# Patient Record
Sex: Male | Born: 1960 | Race: White | Hispanic: No | Marital: Married | Smoking: Former smoker
Health system: Southern US, Community
[De-identification: ages and names within clinical notes are randomized; demographics above are authoritative.]

## PROBLEM LIST (undated history)

## (undated) DIAGNOSIS — M2550 Pain in unspecified joint: Secondary | ICD-10-CM

## (undated) DIAGNOSIS — E538 Deficiency of other specified B group vitamins: Secondary | ICD-10-CM

## (undated) DIAGNOSIS — E119 Type 2 diabetes mellitus without complications: Secondary | ICD-10-CM

## (undated) DIAGNOSIS — R079 Chest pain, unspecified: Secondary | ICD-10-CM

## (undated) DIAGNOSIS — E785 Hyperlipidemia, unspecified: Secondary | ICD-10-CM

## (undated) DIAGNOSIS — M549 Dorsalgia, unspecified: Secondary | ICD-10-CM

## (undated) DIAGNOSIS — E878 Other disorders of electrolyte and fluid balance, not elsewhere classified: Secondary | ICD-10-CM

## (undated) HISTORY — DX: Deficiency of other specified B group vitamins: E53.8

## (undated) HISTORY — DX: Chest pain, unspecified: R07.9

## (undated) HISTORY — DX: Dorsalgia, unspecified: M54.9

## (undated) HISTORY — DX: Pain in unspecified joint: M25.50

---

## 2014-01-03 ENCOUNTER — Ambulatory Visit: Payer: Self-pay | Admitting: Unknown Physician Specialty

## 2014-01-07 HISTORY — PX: MEDIAL PARTIAL KNEE REPLACEMENT: SHX5965

## 2014-04-25 DIAGNOSIS — Z96651 Presence of right artificial knee joint: Secondary | ICD-10-CM | POA: Insufficient documentation

## 2014-05-25 DIAGNOSIS — N521 Erectile dysfunction due to diseases classified elsewhere: Secondary | ICD-10-CM

## 2014-05-25 DIAGNOSIS — Z79899 Other long term (current) drug therapy: Secondary | ICD-10-CM | POA: Insufficient documentation

## 2014-05-25 DIAGNOSIS — M1990 Unspecified osteoarthritis, unspecified site: Secondary | ICD-10-CM | POA: Insufficient documentation

## 2014-05-25 DIAGNOSIS — E119 Type 2 diabetes mellitus without complications: Secondary | ICD-10-CM | POA: Insufficient documentation

## 2014-05-25 DIAGNOSIS — E1169 Type 2 diabetes mellitus with other specified complication: Secondary | ICD-10-CM | POA: Insufficient documentation

## 2014-06-17 ENCOUNTER — Emergency Department: Payer: Self-pay | Admitting: Emergency Medicine

## 2015-01-11 ENCOUNTER — Ambulatory Visit: Payer: Self-pay | Admitting: Gastroenterology

## 2015-01-11 LAB — HM COLONOSCOPY

## 2015-03-04 LAB — SURGICAL PATHOLOGY

## 2015-06-10 DIAGNOSIS — E782 Mixed hyperlipidemia: Secondary | ICD-10-CM | POA: Insufficient documentation

## 2015-06-10 DIAGNOSIS — E1169 Type 2 diabetes mellitus with other specified complication: Secondary | ICD-10-CM | POA: Insufficient documentation

## 2015-06-13 ENCOUNTER — Ambulatory Visit
Admission: EM | Admit: 2015-06-13 | Discharge: 2015-06-13 | Disposition: A | Discharge status: Home / Self Care | Payer: 59 | Attending: Family Medicine | Admitting: Family Medicine

## 2015-06-13 DIAGNOSIS — S39012A Strain of muscle, fascia and tendon of lower back, initial encounter: Secondary | ICD-10-CM | POA: Diagnosis not present

## 2015-06-13 HISTORY — DX: Hyperlipidemia, unspecified: E78.5

## 2015-06-13 HISTORY — DX: Type 2 diabetes mellitus without complications: E11.9

## 2015-06-13 MED ORDER — IBUPROFEN 600 MG PO TABS: 600.0000 mg | ORAL_TABLET | Freq: Three times a day (TID) | ORAL | Status: AC

## 2015-06-13 NOTE — Discharge Instructions (Signed)
Use IBUPROFEN as directed.  Take with food. Return here or to PCP if severe pain, numbness, weakness or changes in movement. 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 About 1 in 5 people have back pain.The cause of low back pain is rarely dangerous. The pain often gets better over time.About half of people with a sudden onset of back pain feel better in just 2 weeks. About 8 in 10 people feel better by 6 weeks.  CAUSES Some common causes of back pain include:  Strain of the muscles or ligaments supporting the spine.  Wear and tear (degeneration) of the spinal discs.  Arthritis.  Direct injury to the back. DIAGNOSIS Most of the time, the direct cause of low back pain is not known.However, back pain can be treated effectively even when the exact cause of the pain is unknown.Answering your caregiver's questions about your overall health and symptoms is one of the most accurate ways to make sure the cause of your pain is not dangerous. If your caregiver needs more information, he or she may order lab work or imaging tests (X-rays or MRIs).However, even if imaging tests show changes in your back, this usually does not require surgery. HOME CARE INSTRUCTIONS For many people, back pain returns.Since low back pain is rarely dangerous, it is often a condition that people can learn to [facility] own.   Remain active. It is stressful on the back to sit or stand in one place. Do not sit, drive, or stand in one place for more than 30 minutes at a time. Take short walks on level surfaces as soon as pain allows.Try to increase the length of time you walk each day.  Do not stay in bed.Resting more than 1 or 2 days can delay your recovery.  Do not avoid exercise or work.Your body is made to move.It is not dangerous to be active, even though your back may hurt.Your back will likely heal  faster if you return to being active before your pain is gone.  Pay attention to your body when you bend and lift. Many people have less discomfortwhen lifting if they bend their knees, keep the load close to their bodies,and avoid twisting. Often, the most comfortable positions are those that put less stress on your recovering back.  Find a comfortable position to sleep. Use a firm mattress and lie on your side with your knees slightly bent. If you lie on your back, put a pillow under your knees.  Only take over-the-counter or prescription medicines as directed by your caregiver. Over-the-counter medicines to reduce pain and inflammation are often the most helpful.Your caregiver may prescribe muscle relaxant drugs.These medicines help dull your pain so you can more quickly return to your normal activities and healthy exercise.  Put ice on the injured area.  Put ice in a plastic bag.  Place a towel between your skin and the bag.  Leave the ice on for 15-20 minutes, 03-04 times a day for the first 2 to 3 days. After that, ice and heat may be alternated to reduce pain and spasms.  Ask your caregiver about trying back exercises and gentle massage. This may be of some benefit.  Avoid feeling anxious or stressed.Stress increases muscle tension and can worsen back pain.It is important to recognize when you are anxious or stressed and learn ways to manage it.Exercise is a great option.  SEEK MEDICAL CARE IF:  You have pain that is not relieved with rest or medicine.  You have pain that does not improve in 1 week.  You have new symptoms.  You are generally not feeling well. SEEK IMMEDIATE MEDICAL CARE IF:   You have pain that radiates from your back into your legs.  You develop new bowel or bladder control problems.  You have unusual weakness or numbness in your arms or legs.  You develop nausea or vomiting.  You develop abdominal pain.  You feel faint. Document Released:  10/26/2005 Document Revised: 04/26/2012 Document Reviewed: 02/27/2014 [facility] Patient Information 2015 [place], [place]. This information is not intended to replace advice given to you by your health care provider. Make sure you discuss any questions you have with your health care provider. Back Injury Prevention Back injuries can be extremely painful and difficult to heal. After having one back injury, you are much more likely to experience another later on. It is important to learn how to avoid injuring or re-injuring your back. The following tips can help you to prevent a back injury. PHYSICAL FITNESS  Exercise regularly and try to develop good tone in your abdominal muscles. Your abdominal muscles provide a lot of the support needed by your back.  Do aerobic exercises (walking, jogging, biking, swimming) regularly.  Do exercises that increase balance and strength (tai chi, yoga) regularly. This can decrease your risk of falling and injuring your back.  Stretch before and after exercising.  Maintain a healthy weight. The more you weigh, the more stress is placed on your back. For every pound of weight, 10 times that amount of pressure is placed on the back. DIET  Talk to your caregiver about how much calcium and vitamin D you need per day. These nutrients help to prevent weakening of the bones (osteoporosis). Osteoporosis can cause broken (fractured) bones that lead to back pain.  Include good sources of calcium in your diet, such as dairy products, green, leafy vegetables, and products with calcium added (fortified).  Include good sources of vitamin D in your diet, such as milk and foods that are fortified with vitamin D.  Consider taking a nutritional supplement or a multivitamin if needed.  Stop smoking if you smoke. POSTURE  Sit and stand up straight. Avoid leaning forward when you sit or hunching over when you stand.  Choose chairs with good low back (lumbar) support.  If you  work at a desk, sit close to your work so you do not need to lean over. Keep your chin tucked in. Keep your neck drawn back and elbows bent at a right angle. Your arms should look like the letter "L."  Sit high and close to the steering wheel when you drive. Add a lumbar support to your car seat if needed.  Avoid sitting or standing in one position for too long. Take breaks to get up, stretch, and walk around at least once every hour. Take breaks if you are driving for long periods of time.  Sleep on your side with your knees slightly bent, or sleep on your back with a pillow under your knees. Do not sleep on your stomach. LIFTING, TWISTING, AND REACHING  Avoid heavy lifting, especially repetitive lifting. If you must do heavy lifting:  Stretch before lifting.  Work slowly.  Rest between lifts.  Use carts and dollies to move objects when possible.  Make several small trips instead of carrying 1 heavy load.  Ask for help when  you need it.  Ask for help when moving big, awkward objects.  Follow these steps when lifting:  Stand with your feet shoulder-width apart.  Get as close to the object as you can. Do not try to pick up heavy objects that are far from your body.  Use handles or lifting straps if they are available.  Bend at your knees. Squat down, but keep your heels off the floor.  Keep your shoulders pulled back, your chin tucked in, and your back straight.  Lift the object slowly, tightening the muscles in your legs, abdomen, and buttocks. Keep the object as close to the center of your body as possible.  When you put a load down, use these same guidelines in reverse.  Do not:  Lift the object above your waist.  Twist at the waist while lifting or carrying a load. Move your feet if you need to turn, not your waist.  Bend over without bending at your knees.  Avoid reaching over your head, across a table, or for an object on a high surface. OTHER TIPS  Avoid wet  floors and keep sidewalks clear of ice to prevent falls.  Do not sleep on a mattress that is too soft or too hard.  Keep items that are used frequently within easy reach.  Put heavier objects on shelves at waist level and lighter objects on lower or higher shelves.  Find ways to decrease your stress, such as exercise, massage, or relaxation techniques. Stress can build up in your muscles. Tense muscles are more vulnerable to injury.  Seek treatment for depression or anxiety if needed. These conditions can increase your risk of developing back pain. SEEK MEDICAL CARE IF:  You injure your back.  You have questions about diet, exercise, or other ways to prevent back injuries. MAKE SURE YOU:  Understand these instructions.  Will watch your condition.  Will get help right away if you are not doing well or get worse. Document Released: 12/03/2004 Document Revised: 01/18/2012 Document Reviewed: 12/07/2011 [facility] Patient Information 2015 [place], [place]. This information is not intended to replace advice given to you by your health care provider. Make sure you discuss any questions you have with your health care provider.

## 2015-06-13 NOTE — ED Notes (Signed)
Patient states that on Sunday 06/09/2015 he picked up a lawn tractor and states that Monday he stayed out of work secondary to stiffness. States that he returned to work on Tuesday and was on his feet a lot and moving heavy objects and back has worsened. Patient states that he is still having a lot of stiffness worse with sitting.

## 2015-06-13 NOTE — ED Provider Notes (Signed)
CSN: [id number]     Arrival date & time 06/13/15  0803 History   First MD Initiated Contact with Patient 06/13/15 [phone]     Chief Complaint  Patient presents with  . Back Pain    The history is provided by the patient.    Patient states 4 days ago, he decided to lift a riding tractor that morning.  He bent over & felt a pop in lower back without any pain.  The following day he stayed home from work & used ice, stretching & Icy hot.  He tried Ibu 800mg  that day.  He worked Tuesday & came home with severe stiffness. 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  Pain 4/10.  Pain worse with bending over.  Pain is in lower back, aching, does not radiate.  Denies numbness of extremities, weakness or paresthesias.  He gives hx of chronic lumbar back pain but denies any chronic pain medications.  He has seen a chiropractor years ago.  Ibu works very well for his pain.  Past Medical History  Diagnosis Date  . Diabetes   . Hyperlipidemia    Past Surgical History  Procedure Laterality Date  . 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 Angina Father    History  Substance Use Topics  . Smoking status: Former Smoker    Quit date: 11/09/1992  . Smokeless tobacco: Not on file  . Alcohol Use: No    Review of Systems  Constitutional: Negative.   HENT: Negative.   Eyes: Negative.   Respiratory: Negative.   Cardiovascular: Negative.   Endocrine: Negative.   Genitourinary: Negative.   Musculoskeletal: Positive for back pain. Negative for myalgias, joint swelling, arthralgias, gait problem, neck pain and neck stiffness.  Skin: Negative.   Neurological: Negative.   Hematological: Negative.   Psychiatric/Behavioral: Negative.     Allergies  Review of patient's allergies indicates no known allergies.  Home Medications   Prior to Admission medications   Medication Sig Start Date End Date Taking? Authorizing Provider  aspirin 81 MG  tablet Take 81 mg by mouth daily.   Yes Historical Provider, MD  atorvastatin (LIPITOR) 20 MG tablet Take 20 mg by mouth daily.   Yes Historical Provider, MD  ibuprofen (ADVIL,MOTRIN) 600 MG tablet Take 600 mg by mouth as needed.   Yes Historical Provider, MD  metFORMIN (GLUCOPHAGE) 500 MG tablet Take 500 mg by mouth 3 (three) times daily.   Yes Historical Provider, MD   BP 135/80 mmHg  Pulse 67  Temp(Src) 98.3 F (36.8 C) (Tympanic)  Resp 17  Ht 6\' 8"  (2.032 m)  Wt 390 lb (176.903 kg)  BMI 42.84 kg/m2  SpO2 97% Physical Exam  Constitutional: He is oriented to person, place, and time. He appears well-developed. No distress.  Morbidly obese  HENT:  Head: Normocephalic and atraumatic.  Eyes: Conjunctivae are normal. Right eye exhibits no discharge. Left eye exhibits no discharge. No scleral icterus.  Neck: Normal range of motion. Neck supple. No thyromegaly present.  Cardiovascular: Normal rate and regular rhythm.  Exam reveals no gallop and no friction rub.   No murmur heard. Pulmonary/Chest: Effort normal. No respiratory distress.  Abdominal: Soft. He exhibits no distension.  Musculoskeletal: He exhibits no edema.       Lumbar back: He exhibits tenderness, pain and spasm. He exhibits normal range of motion, no bony tenderness, no swelling, no edema, no deformity, no laceration  and normal pulse.       Back:  Lymphadenopathy:    He has no cervical adenopathy.  Neurological: He is alert and oriented to person, place, and time. He has normal strength and normal reflexes. He displays no atrophy and no tremor. No cranial nerve deficit or sensory deficit. He exhibits normal muscle tone. Coordination and gait normal.  Skin: Skin is warm, dry and intact. No rash noted. He is not diaphoretic. No cyanosis. Nails show no clubbing.  Psychiatric: He has a normal mood and affect. His speech is normal and behavior is normal. Judgment and thought content normal. Cognition and memory are normal.     ED Course  Procedures  MDM   1. Low back strain, initial encounter    Patient has acute lumboscaral strain s/p heavy lifting. No alarm features. Neuro exam intact and good movement. Patient reassured. He understands warning signs and when to return to the clinic.  Discussed his care with Dr. Thurmond Butts.  Plan: 1. Rx as per orders; risks, benefits, potential side effects reviewed with patient 2. Return here or to PCP if severe pain, numbness, weakness or changes in movement. 3. Work note given 4. Warm moist compresses for at least 4 times daily   Joselyn Arrow, NP 06/13/15 [phone]

## 2015-08-03 ENCOUNTER — Encounter: Payer: Self-pay | Admitting: Emergency Medicine

## 2015-08-03 ENCOUNTER — Ambulatory Visit
Admission: EM | Admit: 2015-08-03 | Discharge: 2015-08-03 | Disposition: A | Discharge status: Home / Self Care | Payer: 59 | Attending: Family Medicine | Admitting: Family Medicine

## 2015-08-03 DIAGNOSIS — J40 Bronchitis, not specified as acute or chronic: Secondary | ICD-10-CM | POA: Diagnosis not present

## 2015-08-03 DIAGNOSIS — J0100 Acute maxillary sinusitis, unspecified: Secondary | ICD-10-CM

## 2015-08-03 MED ORDER — AZITHROMYCIN 250 MG PO TABS: ORAL_TABLET | ORAL | Status: DC

## 2015-08-03 NOTE — ED Notes (Signed)
Congestion in chest for week&a half. Cough worse with lying down. No fevers.

## 2015-08-03 NOTE — ED Provider Notes (Signed)
CSN: [id number]     Arrival date & time 08/03/15  0828 History   First MD Initiated Contact with Patient 08/03/15 [phone]     Chief Complaint  Patient presents with  . chest congestion    (Consider location/radiation/quality/duration/timing/severity/associated sxs/prior Treatment) Patient is a 54 y.o. male presenting with cough. The history is provided by the patient.  Cough Cough characteristics:  Productive Severity:  Moderate Onset quality:  Sudden Duration:  2 weeks Timing:  Constant Chronicity:  New Smoker: no (former smoker for nine years; quit over 20 yrs ago)   Associated symptoms: chills, shortness of breath and sinus congestion     Past Medical History  Diagnosis Date  . Diabetes   . Hyperlipidemia    Past Surgical History  Procedure Laterality Date  . 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 Angina Father    Social History  Substance Use Topics  . Smoking status: Former Smoker    Quit date: 11/09/1992  . Smokeless tobacco: None  . Alcohol Use: No    Review of Systems  Constitutional: Positive for chills.  Respiratory: Positive for cough and shortness of breath.     Allergies  Review of patient's allergies indicates no known allergies.  Home Medications   Prior to Admission medications   Medication Sig Start Date End Date Taking? Authorizing Provider  aspirin 81 MG tablet Take 81 mg by mouth daily.   Yes Historical Provider, MD  atorvastatin (LIPITOR) 20 MG tablet Take 20 mg by mouth daily.   Yes Historical Provider, MD  ibuprofen (ADVIL,MOTRIN) 600 MG tablet Take 1 tablet (600 mg total) by mouth 3 (three) times daily. 06/13/15  Yes Joselyn Arrow, NP  metFORMIN (GLUCOPHAGE) 500 MG tablet Take 500 mg by mouth 3 (three) times daily.   Yes Historical Provider, MD  azithromycin (ZITHROMAX Z-PAK) 250 MG tablet 2 tabs po once day 1, then 1 tab po qd for next 4 days 08/03/15   Payton Mccallum, MD   Meds  Ordered and Administered this Visit  Medications - No data to display  BP 143/76 mmHg  Pulse 64  Temp(Src) 98.3 F (36.8 C) (Oral)  Resp 20  Ht  (2.007 m)  Wt 390 lb (176.903 kg)  BMI 43.92 kg/m2  SpO2 100% No data found.   Physical Exam  Constitutional: He appears well-developed and well-nourished. No distress.  HENT:  Head: Normocephalic and atraumatic.  Right Ear: Tympanic membrane, external ear and ear canal normal.  Left Ear: Tympanic membrane, external ear and ear canal normal.  Nose: Mucosal edema and rhinorrhea present.  Mouth/Throat: Uvula is midline, oropharynx is clear and moist and mucous membranes are normal. No oropharyngeal exudate or tonsillar abscesses.  Eyes: Conjunctivae and EOM are normal. Pupils are equal, round, and reactive to light. Right eye exhibits no discharge. Left eye exhibits no discharge. No scleral icterus.  Neck: Normal range of motion. Neck supple. No tracheal deviation present. No thyromegaly present.  Cardiovascular: Normal rate, regular rhythm and normal heart sounds.   Pulmonary/Chest: Effort normal. No stridor. No respiratory distress. He has no wheezes. He has no rales. He exhibits no tenderness.  Diffuse rhonchi  Lymphadenopathy:    He has no cervical adenopathy.  Neurological: He is alert.  Skin: Skin is warm and dry. No rash noted. He is not diaphoretic.  Nursing note and vitals reviewed.   ED Course  Procedures (including critical care time)  Labs  Review Labs Reviewed - No data to display  Imaging Review No results found.   Visual Acuity Review  Right Eye Distance:   Left Eye Distance:   Bilateral Distance:    Right Eye Near:   Left Eye Near:    Bilateral Near:         MDM   1. Bronchitis   2. Acute maxillary sinusitis, recurrence not specified    Discharge Medication List as of 08/03/2015  9:49 AM    START taking these medications   Details  azithromycin (ZITHROMAX Z-PAK) 250 MG tablet 2 tabs po once  day 1, then 1 tab po qd for next 4 days, Normal      Plan: 1  diagnosis reviewed with patient 2. rx as per orders; risks, benefits, potential side effects reviewed with patient 3. Recommend supportive treatment with increased fluids, rest, otc cough medication 4. F/u prn if symptoms worsen or don't improve    Payton Mccallum, MD 08/03/15 [phone]

## 2015-09-29 ENCOUNTER — Encounter: Payer: Self-pay | Admitting: Gynecology

## 2015-09-29 ENCOUNTER — Ambulatory Visit
Admission: EM | Admit: 2015-09-29 | Discharge: 2015-09-29 | Disposition: A | Discharge status: Home / Self Care | Payer: 59 | Attending: Family Medicine | Admitting: Family Medicine

## 2015-09-29 DIAGNOSIS — R0981 Nasal congestion: Secondary | ICD-10-CM | POA: Diagnosis not present

## 2015-09-29 DIAGNOSIS — H109 Unspecified conjunctivitis: Secondary | ICD-10-CM | POA: Diagnosis not present

## 2015-09-29 MED ORDER — POLYMYXIN B-TRIMETHOPRIM 10000-0.1 UNIT/ML-% OP SOLN: 1.0000 [drp] | Freq: Four times a day (QID) | OPHTHALMIC | Status: AC

## 2015-09-29 MED ORDER — AZITHROMYCIN 250 MG PO TABS: ORAL_TABLET | ORAL | Status: DC

## 2015-09-29 NOTE — ED Notes (Signed)
Bilateral eye mucous /oozing / redness.

## 2015-09-29 NOTE — ED Provider Notes (Signed)
Patient presents today with symptoms of bilateral mattiness to eyes starting this morning. Patient has had some nasal congestion with some yellow drainage for the last few days. He denies any productive cough, fever. He does wear glasses. He denies wearing contact lenses. He denies any vision loss or eye pain.  ROS: Negative except mentioned above.  Vitals as per Epic. GENERAL: NAD HEENT: no pharyngeal erythema, no exudate, no erythema of TMs, mild erythema of bilateral conjunctiva, minimal dry discharge on eyelids, PERRL, EOMI RESP: CTA B CARD: RRR NEURO: CN II-XII grossly intact   A/P: 1)Bilateral Conjunctivitis- will treat with Polytrim Optho., Claritin when necessary, wash hands frequently, wash pillow cases and linens that of touch face, work excuse for tomorrow, if symptoms do persist or worsen I do recommend that patient follow up with eye doctor. 2)Nasal Congestion- discussed use of Claritin, Zithromax prescribed, seek medical attention if symptoms persist or worsen.    Jolene ProvostKirtida Stephen Turnbaugh, MD 09/29/15 1007

## 2016-01-05 ENCOUNTER — Ambulatory Visit
Admission: EM | Admit: 2016-01-05 | Discharge: 2016-01-05 | Disposition: A | Discharge status: Home / Self Care | Payer: 59 | Attending: Family Medicine | Admitting: Family Medicine

## 2016-01-05 DIAGNOSIS — J069 Acute upper respiratory infection, unspecified: Secondary | ICD-10-CM | POA: Diagnosis not present

## 2016-01-05 DIAGNOSIS — B9789 Other viral agents as the cause of diseases classified elsewhere: Principal | ICD-10-CM

## 2016-01-05 HISTORY — DX: Other disorders of electrolyte and fluid balance, not elsewhere classified: E87.8

## 2016-01-05 MED ORDER — HYDROCOD POLST-CPM POLST ER 10-8 MG/5ML PO SUER: 5.0000 mL | Freq: Two times a day (BID) | ORAL | Status: DC

## 2016-01-05 NOTE — ED Provider Notes (Signed)
CSN: [id number]     Arrival date & time 01/05/16  7829 History   First MD Initiated Contact with Patient 01/05/16 0914     Chief Complaint  Patient presents with  . URI   (Consider location/radiation/quality/duration/timing/severity/associated sxs/prior Treatment) Patient is a 55 y.o. male presenting with URI. The history is provided by the patient.  URI Presenting symptoms: congestion, cough, fever and rhinorrhea   Presenting symptoms: no ear pain and no facial pain   Severity:  Moderate Onset quality:  Sudden Duration:  5 days Timing:  Constant Progression:  Waxing and waning Chronicity:  New Relieved by:  None tried Ineffective treatments:  None tried Associated symptoms: myalgias   Associated symptoms: no headaches, no neck pain, no sinus pain, no sneezing and no wheezing   Risk factors: diabetes mellitus and sick contacts   Risk factors: not elderly, no chronic cardiac disease, no chronic kidney disease, no chronic respiratory disease, no immunosuppression, no recent illness and no recent travel     Past Medical History  Diagnosis Date  . Diabetes (HCC)   . Hyperlipidemia   . Hypochloremia    Past Surgical History  Procedure Laterality Date  . 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 Angina Father    Social History  Substance Use Topics  . Smoking status: Former Smoker    Quit date: 11/09/1992  . Smokeless tobacco: None  . Alcohol Use: No    Review of Systems  Constitutional: Positive for fever.  HENT: Positive for congestion and rhinorrhea. Negative for ear pain and sneezing.   Respiratory: Positive for cough. Negative for wheezing.   Musculoskeletal: Positive for myalgias. Negative for neck pain.  Neurological: Negative for headaches.    Allergies  Review of patient's allergies indicates no known allergies.  Home Medications   Prior to Admission medications   Medication Sig Start Date End Date  Taking? Authorizing Provider  aspirin 81 MG tablet Take 81 mg by mouth daily.   Yes Historical Provider, MD  atorvastatin (LIPITOR) 20 MG tablet Take 20 mg by mouth daily.   Yes Historical Provider, MD  metFORMIN (GLUCOPHAGE) 500 MG tablet Take 500 mg by mouth 3 (three) times daily.   Yes Historical Provider, MD  azithromycin (ZITHROMAX Z-PAK) 250 MG tablet Use for 5 days. 09/29/15   Jolene Provost, MD  chlorpheniramine-HYDROcodone (TUSSIONEX PENNKINETIC ER) 10-8 MG/5ML SUER Take 5 mLs by mouth 2 (two) times daily. 01/05/16   Payton Mccallum, MD  ibuprofen (ADVIL,MOTRIN) 600 MG tablet Take 1 tablet (600 mg total) by mouth 3 (three) times daily. 06/13/15   Joselyn Arrow, NP   Meds Ordered and Administered this Visit  Medications - No data to display  BP 135/81 mmHg  Pulse 64  Temp(Src) 98.6 F (37 C) (Oral)  Resp 16  Ht  (2.032 m)  Wt 400 lb (181.439 kg)  BMI 43.94 kg/m2  SpO2 96% No data found.   Physical Exam  Constitutional: He appears well-developed and well-nourished. No distress.  HENT:  Head: Normocephalic and atraumatic.  Right Ear: Tympanic membrane, external ear and ear canal normal.  Left Ear: Tympanic membrane, external ear and ear canal normal.  Nose: Rhinorrhea present.  Mouth/Throat: Uvula is midline and mucous membranes are normal. Posterior oropharyngeal erythema present. No oropharyngeal exudate, posterior oropharyngeal edema or tonsillar abscesses.  Eyes: Conjunctivae and EOM are normal. Pupils are equal, round, and reactive to light. Right eye exhibits no discharge.  Left eye exhibits no discharge. No scleral icterus.  Neck: Normal range of motion. Neck supple. No tracheal deviation present. No thyromegaly present.  Cardiovascular: Normal rate, regular rhythm and normal heart sounds.   Pulmonary/Chest: Effort normal and breath sounds normal. No stridor. No respiratory distress. He has no wheezes. He has no rales. He exhibits no tenderness.  Lymphadenopathy:     He has no cervical adenopathy.  Neurological: He is alert.  Skin: Skin is warm and dry. No rash noted. He is not diaphoretic.  Nursing note and vitals reviewed.   ED Course  Procedures (including critical care time)  Labs Review Labs Reviewed - No data to display  Imaging Review No results found.   Visual Acuity Review  Right Eye Distance:   Left Eye Distance:   Bilateral Distance:    Right Eye Near:   Left Eye Near:    Bilateral Near:         MDM   1. Viral URI with cough    Discharge Medication List as of 01/05/2016  9:24 AM    START taking these medications   Details  chlorpheniramine-HYDROcodone (TUSSIONEX PENNKINETIC ER) 10-8 MG/5ML SUER Take 5 mLs by mouth 2 (two) times daily., Starting 01/05/2016, Until Discontinued, Normal       1. diagnosis reviewed with patient 2. rx as per orders above; reviewed possible side effects, interactions, risks and benefits  3. Recommend supportive treatment with otc analgesics prn, increased fluids, rest 4. Follow-up prn if symptoms worsen or don't improve    Payton Mccallum, MD 01/05/16 [phone]

## 2016-01-05 NOTE — ED Notes (Signed)
Patient c/o fever, body aches, chest congestion, and cough which started Wednesday.

## 2016-01-07 ENCOUNTER — Other Ambulatory Visit: Payer: Self-pay | Admitting: Unknown Physician Specialty

## 2016-01-07 DIAGNOSIS — M1712 Unilateral primary osteoarthritis, left knee: Secondary | ICD-10-CM

## 2016-01-07 DIAGNOSIS — M4722 Other spondylosis with radiculopathy, cervical region: Secondary | ICD-10-CM

## 2016-01-10 ENCOUNTER — Ambulatory Visit
Admission: RE | Admit: 2016-01-10 | Discharge: 2016-01-10 | Disposition: A | Discharge status: Home / Self Care | Payer: 59 | Source: Ambulatory Visit | Attending: Unknown Physician Specialty | Admitting: Unknown Physician Specialty

## 2016-01-10 DIAGNOSIS — M1712 Unilateral primary osteoarthritis, left knee: Secondary | ICD-10-CM | POA: Insufficient documentation

## 2016-03-08 DIAGNOSIS — Z6841 Body Mass Index (BMI) 40.0 and over, adult: Secondary | ICD-10-CM | POA: Insufficient documentation

## 2016-05-05 DIAGNOSIS — Z96652 Presence of left artificial knee joint: Secondary | ICD-10-CM | POA: Insufficient documentation

## 2016-12-04 DIAGNOSIS — Z96652 Presence of left artificial knee joint: Secondary | ICD-10-CM | POA: Insufficient documentation

## 2016-12-05 DIAGNOSIS — E538 Deficiency of other specified B group vitamins: Secondary | ICD-10-CM | POA: Insufficient documentation

## 2017-02-01 ENCOUNTER — Encounter: Payer: 59 | Attending: Internal Medicine | Admitting: Dietician

## 2017-02-01 ENCOUNTER — Encounter: Payer: Self-pay | Admitting: Dietician

## 2017-02-01 VITALS — BP 112/80 | Ht >= 80 in | Wt >= 6400 oz

## 2017-02-01 DIAGNOSIS — E669 Obesity, unspecified: Secondary | ICD-10-CM | POA: Insufficient documentation

## 2017-02-01 DIAGNOSIS — Z6841 Body Mass Index (BMI) 40.0 and over, adult: Secondary | ICD-10-CM | POA: Insufficient documentation

## 2017-02-01 DIAGNOSIS — Z713 Dietary counseling and surveillance: Secondary | ICD-10-CM | POA: Diagnosis present

## 2017-02-01 DIAGNOSIS — E119 Type 2 diabetes mellitus without complications: Secondary | ICD-10-CM | POA: Insufficient documentation

## 2017-02-01 NOTE — Progress Notes (Signed)
Diabetes Self-Management Education  Visit Type: First/Initial  Appt. Start Time:1700 Appt. End Time: 1800  02/01/2017  Mr. Steve Rojas, identified by name and date of birth, is a 56 y.o. male with a diagnosis of Diabetes: Type 2.   ASSESSMENT  Blood pressure 112/80, height 6\' 8"  (2.032 m), weight (!) 405 lb 4.8 oz (183.8 kg). Body mass index is 44.52 kg/m. Obesity     Diabetes Self-Management Education - 02/01/17 1820      Visit Information   Visit Type First/Initial     Initial Visit   Diabetes Type Type 2     Health Coping   How would you rate your overall health? Fair     Psychosocial Assessment   Patient Belief/Attitude about Diabetes Motivated to manage diabetes   Self-care barriers None   Special Needs None   Preferred Learning Style Auditory   Learning Readiness Ready   What is the last grade level you completed in school? 12+ college     Pre-Education Assessment   Patient understands the diabetes disease and treatment process. Needs Review   Patient understands incorporating nutritional management into lifestyle. Needs Review   Patient undertands incorporating physical activity into lifestyle. Needs Review   Patient understands using medications safely. Needs Review   Patient understands monitoring blood glucose, interpreting and using results Needs Review   Patient understands prevention, detection, and treatment of acute complications. Needs Review   Patient understands prevention, detection, and treatment of chronic complications. Needs Review   Patient understands how to develop strategies to address psychosocial issues. Needs Review   Patient understands how to develop strategies to promote health/change behavior. Needs Review     Complications   Last HgB A1C per patient/outside source 9.9 %  (11-2016)   How often do you check your blood sugar? 1-2 times/day   Fasting Blood glucose range (mg/dL) [id number]   Have you had a dilated eye exam in the past 12  months? Yes  09-2016   Have you had a dental exam in the past 12 months? Yes  6 months ago   Are you checking your feet? Yes   How many days per week are you checking your feet? 4 (c/o mild pain and numbness in feet 2 hr daily-worse at night)     Dietary Intake   Breakfast --  eats 3 meals/day    Snack (morning) --  none   Lunch --  eats lunch at 12p-eats fried foods 2-3x/wk.   Snack (afternoon) --  none   Dinner --  eats supper at 6p   Snack (evening) --  eats snack at 8-9p=nuts, popcorn   Beverage(s) --  drinks 6-7 glasses of water/day and 2-3 glasses of sugar free beverages/day; drinks 2 fruit juices/day + milk 3x/day     Exercise   Exercise Type ADL's     Patient Education   Previous Diabetes Education Yes (please comment)  in [place] several years ago   Disease state  Definition of diabetes, type 1 and 2, and the diagnosis of diabetes;Explored patient's options for treatment of their diabetes   Nutrition management  Role of diet in the treatment of diabetes and the relationship between the three main macronutrients and blood glucose level;Food label reading, portion sizes and measuring food.;Carbohydrate counting   Physical activity and exercise  Role of exercise on diabetes management, blood pressure control and cardiac health.;Helped patient identify appropriate exercises in relation to his/her diabetes, diabetes complications and other health issue.   Medications Reviewed  patients medication for diabetes, action, purpose, timing of dose and side effects.   Monitoring Purpose and frequency of SMBG.;Identified appropriate SMBG and/or A1C goals.;Daily foot exams;Yearly dilated eye exam;Taught/discussed recording of test results and interpretation of SMBG.   Acute complications Taught treatment of hypoglycemia - the 15 rule.;Covered sick day management with medication and food.   Chronic complications Relationship between chronic complications and blood glucose control;Assessed  and discussed foot care and prevention of foot problems;Lipid levels, blood glucose control and heart disease;Identified and discussed with patient  current chronic complications;Dental care;Retinopathy and reason for yearly dilated eye exams;Nephropathy, what it is, prevention of, the use of ACE, ARB's and early detection of through urine microalbumia.;Reviewed with patient heart disease, higher risk of, and prevention   Psychosocial adjustment Role of stress on diabetes   Personal strategies to promote health Lifestyle issues that need to be addressed for better diabetes care;Helped patient develop diabetes management plan for (enter comment)      Individualized Plan for Diabetes Self-Management Training:   Learning Objective:  Patient will have a greater understanding of diabetes self-management. Patient education plan is to attend individual and/or group sessions per assessed needs and concerns.   Plan:   Patient Instructions   Check blood sugars 2 x day before breakfast and 2 hrs after supper every day and record  Exercise:  Ride  Bike /do strenthening exercises  for    15-20  minutes 3 days a week  Avoid sugar sweetened drinks (soda, tea, coffee, sports drinks, juices)  Limit intake of sweets and fried foods  Eat 3 meals day,   2  snacks a day in afternoon and at bedtime  Space meals 4-5 hours apart  Eat 4 carbohydrate servings/meal + proteinEat 1 carbohydrate serving/snack + protein  Complete 3 Day Food Record and bring to next appt  Make a dentist  appointment  Bring blood sugar records to the next appointment/class  Get a Sharps container  Carry fast acting glucose at all times  Return for appointment/classes on:  03-05-17   Expected Outcomes:   positive  Education material provided: General meal planning guidelines, low BG handout, Living Well With Diabetes booklet, Foot care handout, A1C handout  If problems or questions, patient to contact team via:   [phone]  Future DSME appointment:  03-05-17

## 2017-02-01 NOTE — Patient Instructions (Signed)
  Check blood sugars 2 x day before breakfast and 2 hrs after supper every day and record  Exercise:  Ride  Bike /do strenthening exercises  for    15-20  minutes 3 days a week  Avoid sugar sweetened drinks (soda, tea, coffee, sports drinks, juices)  Limit intake of sweets and fried foods  Eat 3 meals day,   2  snacks a day in afternoon and at bedtime  Space meals 4-5 hours apart  Eat 4 carbohydrate servings/meal + proteinEat 1 carbohydrate serving/snack + protein  Complete 3 Day Food Record and bring to next appt  Make a dentist  appointment  Bring blood sugar records to the next appointment/class  Get a Sharps container  Carry fast acting glucose at all times  Return for appointment/classes on:  03-05-17

## 2017-03-05 ENCOUNTER — Encounter: Payer: Self-pay | Admitting: Dietician

## 2017-03-05 ENCOUNTER — Encounter: Payer: 59 | Attending: Internal Medicine | Admitting: Dietician

## 2017-03-05 VITALS — BP 130/88 | Ht >= 80 in | Wt >= 6400 oz

## 2017-03-05 DIAGNOSIS — E669 Obesity, unspecified: Secondary | ICD-10-CM | POA: Diagnosis not present

## 2017-03-05 DIAGNOSIS — E119 Type 2 diabetes mellitus without complications: Secondary | ICD-10-CM | POA: Insufficient documentation

## 2017-03-05 DIAGNOSIS — Z6841 Body Mass Index (BMI) 40.0 and over, adult: Secondary | ICD-10-CM | POA: Insufficient documentation

## 2017-03-05 DIAGNOSIS — Z713 Dietary counseling and surveillance: Secondary | ICD-10-CM | POA: Diagnosis not present

## 2017-03-05 NOTE — Progress Notes (Signed)
Diabetes Self-Management Education  Visit Type:  Follow-up  Appt. Start Time: 1115 Appt. End Time: 1215  03/05/2017  Mr. Steve Rojas, identified by name and date of birth, is a 56 y.o. male with a diagnosis of Diabetes:  .   ASSESSMENT  Blood pressure 130/88, height  (2.032 m), weight (!) 402 lb 1.6 oz (182.4 kg). Body mass index is 44.17 kg/m.       Diabetes Self-Management Education - 03/05/17 1118      Complications   How often do you check your blood sugar? 1-2 times/day   Fasting Blood glucose range (mg/dL) 161-096;04-540   Postprandial Blood glucose range (mg/dL) 981-191;478-295   Number of hypoglycemic episodes per month 1   Can you tell when your blood sugar is low? Yes   What do you do if your blood sugar is low? ate snack   Have you had a dilated eye exam in the past 12 months? Yes   Have you had a dental exam in the past 12 months? Yes   Are you checking your feet? Yes   How many days per week are you checking your feet? 4     Dietary Intake   Breakfast 3 meals and 1 snack daily     Exercise   Exercise Type Other (comment)  active job; care of large property on weekends   How many days per week to you exercise? 7     Patient Education   Nutrition management  Role of diet in the treatment of diabetes and the relationship between the three main macronutrients and blood glucose level;Food label reading, portion sizes and measuring food.;Information on hints to eating out and maintain blood glucose control.;Meal options for control of blood glucose level and chronic complications.;Other (comment)  basic meal planning for 2500-3000kcal intake 45%CHO   Physical activity and exercise  Role of exercise on diabetes management, blood pressure control and cardiac health.   Medications Reviewed patients medication for diabetes, action, purpose, timing of dose and side effects.   Monitoring Taught/discussed recording of test results and interpretation of SMBG.     Post-Education Assessment   Patient understands the diabetes disease and treatment process. Demonstrates understanding / competency   Patient understands incorporating nutritional management into lifestyle. Demonstrates understanding / competency   Patient undertands incorporating physical activity into lifestyle. Demonstrates understanding / competency   Patient understands using medications safely. Demonstrates understanding / competency   Patient understands monitoring blood glucose, interpreting and using results Demonstrates understanding / competency   Patient understands prevention, detection, and treatment of acute complications. Demonstrates understanding / competency   Patient understands prevention, detection, and treatment of chronic complications. Demonstrates understanding / competency   Patient understands how to develop strategies to address psychosocial issues. Demonstrates understanding / competency   Patient understands how to develop strategies to promote health/change behavior. Demonstrates understanding / competency     Outcomes   Program Status Completed      Learning Objective:  Patient will have a greater understanding of diabetes self-management. Patient education plan is to attend individual and/or group sessions per assessed needs and concerns.  Steve Rojas has been making diet changes for weight loss for several weeks, and has lost about 20lbs. He is participating in an online wellness program through his employer, and plans to continue. His goal is 100lbs weight loss. Calculated his energy needs for weight loss to be 2500-3000kcal, which, with 45%CHO, would amount to 75-90g carb. per meal, 18oz protein foods, and 93g  fat daily.    Plan:   Patient Instructions   Control portions of starches; eat generous portions of low-carb veggies and moderate portions of proteins.  Try starting with veggies and then eat other foods in your meals.   Limit any juice to 4oz,  dilute with diet soda or water.  OK to have a protein drink as part of a meal.   Keep up regular physical activity.   Continue to track intake as able, aiming for 2500-3000 calories daily.    Expected Outcomes:  Demonstrated interest in learning. Expect positive outcomes  Education material provided: Planning A Balanced Meal  If problems or questions, patient to contact team via:  Phone and Email

## 2017-03-05 NOTE — Patient Instructions (Signed)
   Control portions of starches; eat generous portions of low-carb veggies and moderate portions of proteins.  Try starting with veggies and then eat other foods in your meals.   Limit any juice to 4oz, dilute with diet soda or water.  OK to have a protein drink as part of a meal.   Keep up regular physical activity.   Continue to track intake as able, aiming for 2500-3000 calories daily.

## 2017-06-24 IMAGING — CT CT KNEE*L* W/O CM
1 of 3 series · 7 of 14 positions shown, 9 images · non-contrast
Comparison: None.

CLINICAL DATA: Preop for Drakce partial knee replacement surgery.

EXAM:
CT OF THE left KNEE WITHOUT CONTRAST
TECHNIQUE: Multidetector CT imaging of the left knee was performed according to
the standard protocol. Multiplanar CT image reconstructions were
also generated.

[Series 6: axial st · axial · 0.49mm/px · z∈[+423,+682]mm · 7 of 347 slices shown, 9 images]
[im 44/347  soft-tissue]
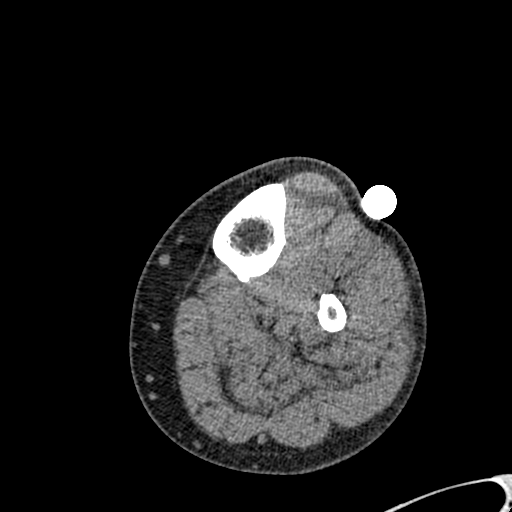
[im 44/347  bone]
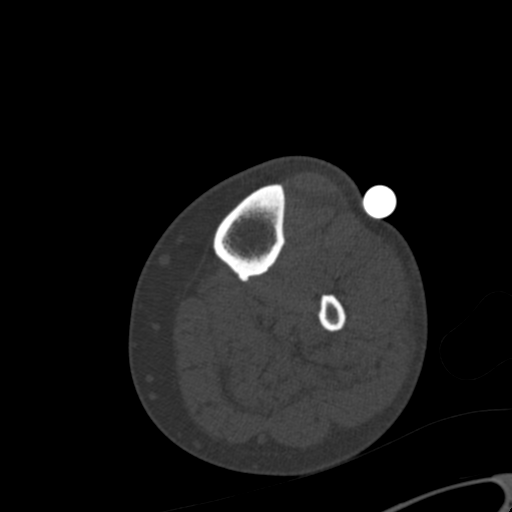
[im 87/347  bone]
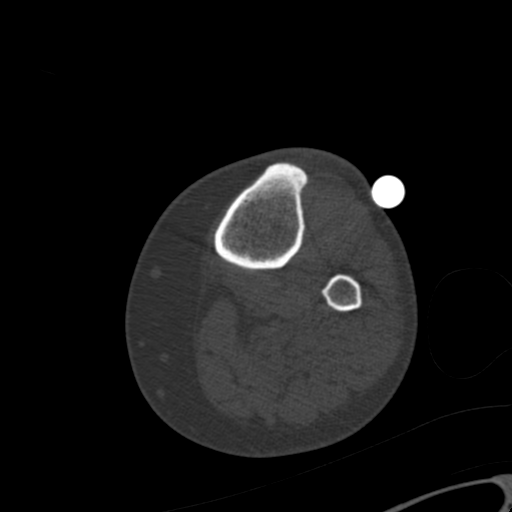
[im 130/347  bone]
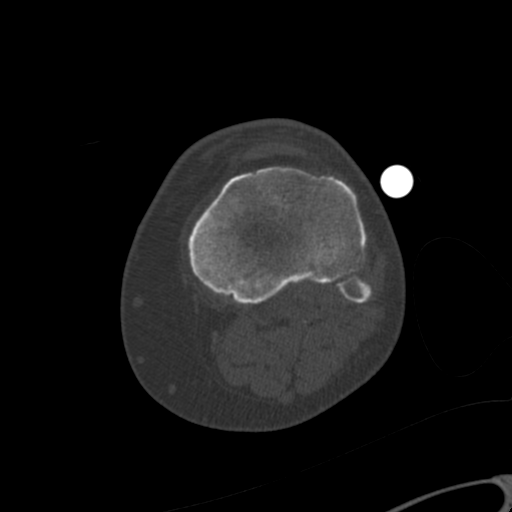
[im 174/347  bone]
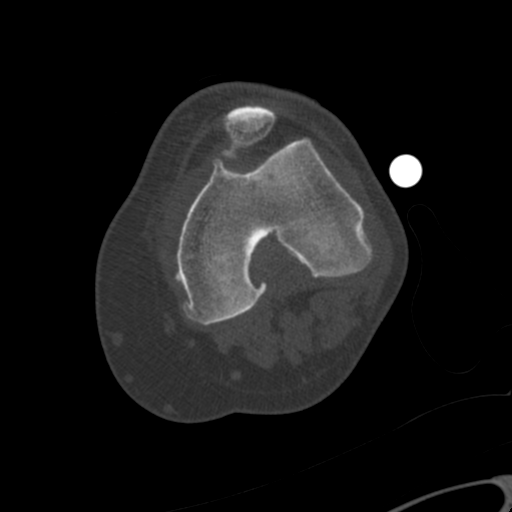
[im 217/347  soft-tissue]
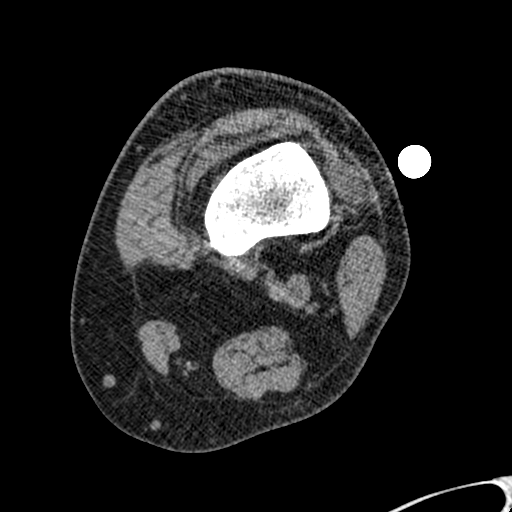
[im 217/347  bone]
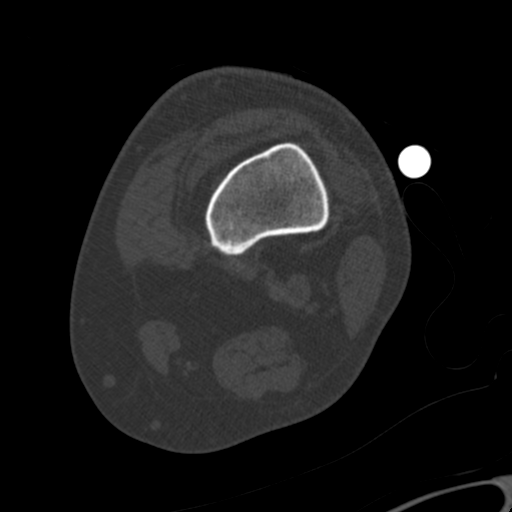
[im 260/347  bone]
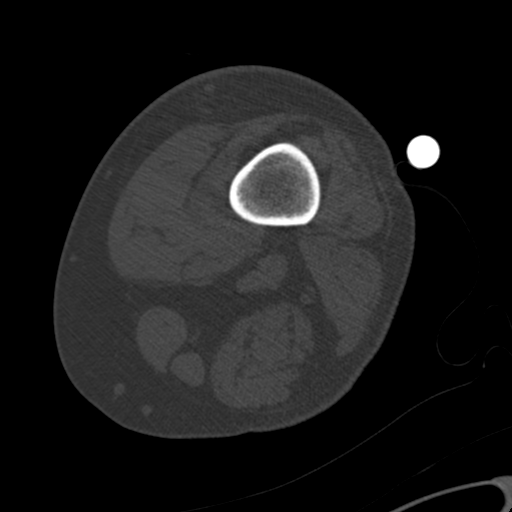
[im 303/347  bone]
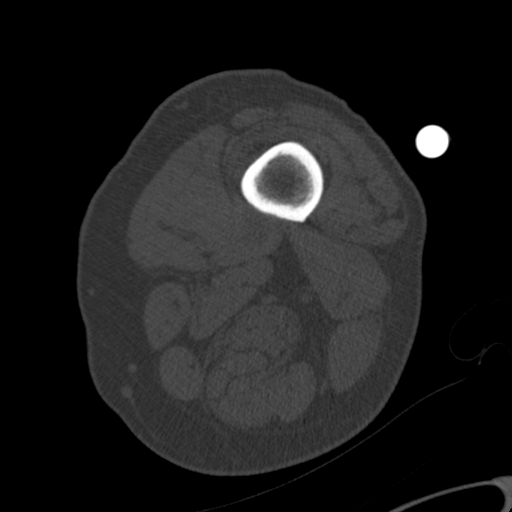

[7 of 14 positions shown; findings below may reference images not displayed]

FINDINGS: Left hip: Axial images do not demonstrate any significant
degenerative changes or AVN.

Left knee: Advanced medial compartment degenerative disease with
joint space narrowing, osteophytic spurring, bony eburnation and
subchondral cystic change. Mild to moderate degenerative changes
involving the patellofemoral joint and lateral compartment. No
worrisome bone lesions or osteochondral abnormality. Large
subchondral cysts are noted involving the posterior most aspect of
the medial tibial plateau.

Left ankle:

Mild ankle joint degenerative changes. No worrisome bone lesions or
osteochondral abnormality.
IMPRESSION: 1. Advanced medial compartment degenerative disease involving the
left knee as described above.
2. Mild degenerative changes involving the ankle and minimal
degenerative changes involving the hip.

## 2017-11-01 ENCOUNTER — Encounter: Payer: Self-pay | Admitting: *Deleted

## 2017-11-01 ENCOUNTER — Ambulatory Visit
Admission: EM | Admit: 2017-11-01 | Discharge: 2017-11-01 | Disposition: A | Discharge status: Home / Self Care | Payer: BLUE CROSS/BLUE SHIELD | Attending: Family Medicine | Admitting: Family Medicine

## 2017-11-01 DIAGNOSIS — H9201 Otalgia, right ear: Secondary | ICD-10-CM

## 2017-11-01 DIAGNOSIS — H6981 Other specified disorders of Eustachian tube, right ear: Secondary | ICD-10-CM | POA: Diagnosis not present

## 2017-11-01 MED ORDER — CETIRIZINE-PSEUDOEPHEDRINE ER 5-120 MG PO TB12: 1.0000 | ORAL_TABLET | Freq: Every day | ORAL | 0 refills | Status: AC

## 2017-11-01 MED ORDER — FLUTICASONE PROPIONATE 50 MCG/ACT NA SUSP: 2.0000 | Freq: Every day | NASAL | 0 refills | Status: AC

## 2017-11-01 NOTE — Discharge Instructions (Signed)
You may benefit from a course of Zyrtec D

## 2017-11-01 NOTE — ED Provider Notes (Signed)
[facility]    CSN: [id number] Arrival date & time: 11/01/17  [id number]     History   Chief Complaint Chief Complaint  Patient presents with  . Otalgia    HPI Steve Rojas is a 56 y.o. male.   HPI  This a 56 year old male who presents with 3-5-day history of right ear pain.  He has had no drainage or fever.  He feels as though the ear is "full".  Denies any tinnitus.  He did have a upper respiratory infection that he was fighting for over a month has since subsided.  He also relates an infection in his left nostril stuffiness that has since improved.  He denies any ear pressure changes such as flight or altitude.         Past Medical History:  Diagnosis Date  . Diabetes (HCC)   . Hyperlipidemia   . Hypochloremia     Patient Active Problem List   Diagnosis Date Noted  . B12 deficiency 12/05/2016  . Status post left unicompartmental knee replacement 12/04/2016  . Status post unicompartmental knee replacement, left 05/05/2016  . Body mass index (BMI) of 45.0-49.9 in adult (HCC) 03/08/2016  . Primary osteoarthritis of left knee 01/07/2016  . Hyperlipidemia, mixed 06/10/2015  . Degenerative joint disease 05/25/2014  . Diabetes mellitus type 2, uncomplicated (HCC) 05/25/2014  . Erectile dysfunction associated with type 2 diabetes mellitus (HCC) 05/25/2014  . High risk medication use 05/25/2014  . Status post right partial knee replacement 04/25/2014    Past Surgical History:  Procedure Laterality Date  . MEDIAL PARTIAL KNEE REPLACEMENT Right 01/2014       Home Medications    Prior to Admission medications   Medication Sig Start Date End Date Taking? Authorizing Provider  aspirin 81 MG tablet Take 81 mg by mouth daily.   Yes [provider]  atorvastatin (LIPITOR) 20 MG tablet Take 20 mg by mouth daily.   Yes [provider]  Cyanocobalamin (B-12 PO) Take 1 tablet by mouth daily.   Yes [provider]  ibuprofen  (ADVIL,MOTRIN) 600 MG tablet Take 1 tablet (600 mg total) by mouth 3 (three) times daily. Patient taking differently: Take 600 mg by mouth every 8 (eight) hours as needed.  06/13/15  Yes Joselyn ArrowJones, Kandice L, NP  metFORMIN (GLUCOPHAGE-XR) 500 MG 24 hr tablet Take 3 tablets by mouth daily. 12/04/16  Yes [provider]  cetirizine-pseudoephedrine (ZYRTEC-D) 5-120 MG tablet Take 1 tablet by mouth daily. 11/01/17   Lutricia Feiloemer, Toren Tucholski P, PA-C  fluticasone (FLONASE) 50 MCG/ACT nasal spray Place 2 sprays into both nostrils daily. 11/01/17   Lutricia Feiloemer, Solstice Lastinger P, PA-C  glimepiride (AMARYL) 2 MG tablet Take 1 tablet by mouth daily. 06/15/16 06/15/17  [provider]  tadalafil (CIALIS) 20 MG tablet Take 1 tablet by mouth daily as needed. As directed for ED 05/28/16 12/05/19  [provider]    Family History Family History  Problem Relation Age of Onset  . Angina Father   . CAD Mother     Social History Social History   Tobacco Use  . Smoking status: Former Smoker    Last attempt to quit: 11/09/1992    Years since quitting: 24.9  . Smokeless tobacco: Never Used  Substance Use Topics  . Alcohol use: No    Alcohol/week: 0.0 oz  . Drug use: No     Allergies   Penicillins   Review of Systems Review of Systems  Constitutional: Negative for activity change,  chills, fatigue and fever.  HENT: Positive for ear pain. Negative for hearing loss.   Respiratory: Negative for shortness of breath.   All other systems reviewed and are negative.    Physical Exam Triage Vital Signs ED Triage Vitals  Enc Vitals Group     BP 11/01/17 0837 (!) 170/90     Pulse Rate 11/01/17 0837 79     Resp 11/01/17 0837 16     Temp 11/01/17 0837 97.8 F (36.6 C)     Temp Source 11/01/17 0837 Oral     SpO2 11/01/17 0837 98 %     Weight 11/01/17 0839 (!) 400 lb (181.4 kg)     Height 11/01/17 0839 6\' 8"  (2.032 m)     Head Circumference --      Peak Flow --      Pain Score 11/01/17 0840 5     Pain  Loc --      Pain Edu? --      Excl. in GC? --    No data found.  Updated Vital Signs BP (!) 170/90 (BP Location: Left Arm)   Pulse 79   Temp 97.8 F (36.6 C) (Oral)   Resp 16   Ht 6\' 8"  (2.032 m)   Wt (!) 400 lb (181.4 kg)   SpO2 98%   BMI 43.94 kg/m   Visual Acuity Right Eye Distance:   Left Eye Distance:   Bilateral Distance:    Right Eye Near:   Left Eye Near:    Bilateral Near:     Physical Exam  Constitutional: He appears well-developed and well-nourished. No distress.  HENT:  Head: Normocephalic.  Left Ear: External ear normal.  Nose: Nose normal.  Mouth/Throat: Oropharynx is clear and moist. No oropharyngeal exudate.  Examination of the right ear shows the canal to be normal.  TM is very dark with decreased light reflex.  No tenderness with movement of the tragus.  He does have some tenderness along the inferior jaw line just inferior to the ear.  Eyes: Pupils are equal, round, and reactive to light. Right eye exhibits no discharge. Left eye exhibits no discharge.  Neck: Normal range of motion. Neck supple.  Skin: He is not diaphoretic.  Nursing note and vitals reviewed.    UC Treatments / Results  Labs (all labs ordered are listed, but only abnormal results are displayed) Labs Reviewed - No data to display  EKG  EKG Interpretation None       Radiology No results found.  Procedures Procedures (including critical care time)  Medications Ordered in UC Medications - No data to display   Initial Impression / Assessment and Plan / UC Course  I have reviewed the triage vital signs and the nursing notes.  Pertinent labs & imaging results that were available during my care of the patient were reviewed by me and considered in my medical decision making (see chart for details).     Plan: 1. Test/x-ray results and diagnosis reviewed with patient 2. rx as per orders; risks, benefits, potential side effects reviewed with patient 3. Recommend  supportive treatment with use of Flonase nasal spray and Zyrtec-D.  If is not improving in 2 weeks he should follow-up with ENT.  Name and address was provided to the patient. 4. F/u prn if symptoms worsen or don't improve   Final Clinical Impressions(s) / UC Diagnoses   Final diagnoses:  Eustachian tube dysfunction, right    ED Discharge Orders  Ordered    fluticasone (FLONASE) 50 MCG/ACT nasal spray  Daily     11/01/17 0921    cetirizine-pseudoephedrine (ZYRTEC-D) 5-120 MG tablet  Daily     11/01/17 0926       Controlled Substance Prescriptions [place] Controlled Substance Registry consulted? Not Applicable   Lutricia FeilRoemer, Hashem Goynes P, PA-C 11/01/17 [id number]

## 2017-11-01 NOTE — ED Triage Notes (Signed)
Right ear pain x3-5 days. Denies drainage and fever.

## 2021-05-22 DIAGNOSIS — M170 Bilateral primary osteoarthritis of knee: Secondary | ICD-10-CM | POA: Diagnosis not present

## 2021-05-22 DIAGNOSIS — E1169 Type 2 diabetes mellitus with other specified complication: Secondary | ICD-10-CM | POA: Diagnosis not present

## 2021-05-22 DIAGNOSIS — Z6841 Body Mass Index (BMI) 40.0 and over, adult: Secondary | ICD-10-CM | POA: Diagnosis not present

## 2021-05-22 DIAGNOSIS — Z79899 Other long term (current) drug therapy: Secondary | ICD-10-CM | POA: Diagnosis not present

## 2021-08-04 DIAGNOSIS — M5451 Vertebrogenic low back pain: Secondary | ICD-10-CM | POA: Diagnosis not present

## 2021-08-04 DIAGNOSIS — M9903 Segmental and somatic dysfunction of lumbar region: Secondary | ICD-10-CM | POA: Diagnosis not present

## 2021-08-04 DIAGNOSIS — M7918 Myalgia, other site: Secondary | ICD-10-CM | POA: Diagnosis not present

## 2021-08-04 DIAGNOSIS — M9904 Segmental and somatic dysfunction of sacral region: Secondary | ICD-10-CM | POA: Diagnosis not present

## 2021-08-06 DIAGNOSIS — M25552 Pain in left hip: Secondary | ICD-10-CM | POA: Diagnosis not present

## 2021-08-06 DIAGNOSIS — M5451 Vertebrogenic low back pain: Secondary | ICD-10-CM | POA: Diagnosis not present

## 2021-08-06 DIAGNOSIS — M9904 Segmental and somatic dysfunction of sacral region: Secondary | ICD-10-CM | POA: Diagnosis not present

## 2021-08-06 DIAGNOSIS — M9903 Segmental and somatic dysfunction of lumbar region: Secondary | ICD-10-CM | POA: Diagnosis not present

## 2021-08-07 DIAGNOSIS — M9904 Segmental and somatic dysfunction of sacral region: Secondary | ICD-10-CM | POA: Diagnosis not present

## 2021-08-07 DIAGNOSIS — M5451 Vertebrogenic low back pain: Secondary | ICD-10-CM | POA: Diagnosis not present

## 2021-08-07 DIAGNOSIS — M25552 Pain in left hip: Secondary | ICD-10-CM | POA: Diagnosis not present

## 2021-08-07 DIAGNOSIS — M9903 Segmental and somatic dysfunction of lumbar region: Secondary | ICD-10-CM | POA: Diagnosis not present

## 2021-08-11 DIAGNOSIS — M9903 Segmental and somatic dysfunction of lumbar region: Secondary | ICD-10-CM | POA: Diagnosis not present

## 2021-08-11 DIAGNOSIS — M5451 Vertebrogenic low back pain: Secondary | ICD-10-CM | POA: Diagnosis not present

## 2021-08-11 DIAGNOSIS — M9904 Segmental and somatic dysfunction of sacral region: Secondary | ICD-10-CM | POA: Diagnosis not present

## 2021-08-11 DIAGNOSIS — M25552 Pain in left hip: Secondary | ICD-10-CM | POA: Diagnosis not present

## 2021-08-22 DIAGNOSIS — Z79899 Other long term (current) drug therapy: Secondary | ICD-10-CM | POA: Diagnosis not present

## 2021-08-22 DIAGNOSIS — M170 Bilateral primary osteoarthritis of knee: Secondary | ICD-10-CM | POA: Diagnosis not present

## 2021-08-22 DIAGNOSIS — Z6841 Body Mass Index (BMI) 40.0 and over, adult: Secondary | ICD-10-CM | POA: Diagnosis not present

## 2021-08-22 DIAGNOSIS — Z0000 Encounter for general adult medical examination without abnormal findings: Secondary | ICD-10-CM | POA: Diagnosis not present

## 2021-08-22 DIAGNOSIS — E1169 Type 2 diabetes mellitus with other specified complication: Secondary | ICD-10-CM | POA: Diagnosis not present

## 2021-08-22 DIAGNOSIS — Z1159 Encounter for screening for other viral diseases: Secondary | ICD-10-CM | POA: Diagnosis not present

## 2021-08-22 DIAGNOSIS — Z23 Encounter for immunization: Secondary | ICD-10-CM | POA: Diagnosis not present

## 2021-11-25 DIAGNOSIS — E785 Hyperlipidemia, unspecified: Secondary | ICD-10-CM | POA: Diagnosis not present

## 2021-11-25 DIAGNOSIS — E1169 Type 2 diabetes mellitus with other specified complication: Secondary | ICD-10-CM | POA: Diagnosis not present

## 2021-11-25 DIAGNOSIS — Z79899 Other long term (current) drug therapy: Secondary | ICD-10-CM | POA: Diagnosis not present

## 2021-11-25 DIAGNOSIS — Z6841 Body Mass Index (BMI) 40.0 and over, adult: Secondary | ICD-10-CM | POA: Diagnosis not present

## 2022-01-02 DIAGNOSIS — M25552 Pain in left hip: Secondary | ICD-10-CM | POA: Diagnosis not present

## 2022-01-02 DIAGNOSIS — M5451 Vertebrogenic low back pain: Secondary | ICD-10-CM | POA: Diagnosis not present

## 2022-01-02 DIAGNOSIS — M9904 Segmental and somatic dysfunction of sacral region: Secondary | ICD-10-CM | POA: Diagnosis not present

## 2022-01-02 DIAGNOSIS — M9903 Segmental and somatic dysfunction of lumbar region: Secondary | ICD-10-CM | POA: Diagnosis not present

## 2022-01-05 DIAGNOSIS — M5451 Vertebrogenic low back pain: Secondary | ICD-10-CM | POA: Diagnosis not present

## 2022-01-05 DIAGNOSIS — M9903 Segmental and somatic dysfunction of lumbar region: Secondary | ICD-10-CM | POA: Diagnosis not present

## 2022-01-05 DIAGNOSIS — M9904 Segmental and somatic dysfunction of sacral region: Secondary | ICD-10-CM | POA: Diagnosis not present

## 2022-01-05 DIAGNOSIS — M25552 Pain in left hip: Secondary | ICD-10-CM | POA: Diagnosis not present

## 2022-01-06 DIAGNOSIS — M5451 Vertebrogenic low back pain: Secondary | ICD-10-CM | POA: Diagnosis not present

## 2022-01-06 DIAGNOSIS — M9904 Segmental and somatic dysfunction of sacral region: Secondary | ICD-10-CM | POA: Diagnosis not present

## 2022-01-06 DIAGNOSIS — M25552 Pain in left hip: Secondary | ICD-10-CM | POA: Diagnosis not present

## 2022-01-06 DIAGNOSIS — M9903 Segmental and somatic dysfunction of lumbar region: Secondary | ICD-10-CM | POA: Diagnosis not present

## 2022-01-09 DIAGNOSIS — M5451 Vertebrogenic low back pain: Secondary | ICD-10-CM | POA: Diagnosis not present

## 2022-01-09 DIAGNOSIS — M9903 Segmental and somatic dysfunction of lumbar region: Secondary | ICD-10-CM | POA: Diagnosis not present

## 2022-01-09 DIAGNOSIS — M9904 Segmental and somatic dysfunction of sacral region: Secondary | ICD-10-CM | POA: Diagnosis not present

## 2022-01-09 DIAGNOSIS — M25552 Pain in left hip: Secondary | ICD-10-CM | POA: Diagnosis not present

## 2022-01-12 DIAGNOSIS — M9904 Segmental and somatic dysfunction of sacral region: Secondary | ICD-10-CM | POA: Diagnosis not present

## 2022-01-12 DIAGNOSIS — M5451 Vertebrogenic low back pain: Secondary | ICD-10-CM | POA: Diagnosis not present

## 2022-01-12 DIAGNOSIS — M25552 Pain in left hip: Secondary | ICD-10-CM | POA: Diagnosis not present

## 2022-01-12 DIAGNOSIS — M9903 Segmental and somatic dysfunction of lumbar region: Secondary | ICD-10-CM | POA: Diagnosis not present

## 2022-03-13 DIAGNOSIS — B309 Viral conjunctivitis, unspecified: Secondary | ICD-10-CM | POA: Diagnosis not present

## 2022-03-16 DIAGNOSIS — Z6841 Body Mass Index (BMI) 40.0 and over, adult: Secondary | ICD-10-CM | POA: Diagnosis not present

## 2022-03-16 DIAGNOSIS — M545 Low back pain, unspecified: Secondary | ICD-10-CM | POA: Diagnosis not present

## 2022-03-16 DIAGNOSIS — Z79899 Other long term (current) drug therapy: Secondary | ICD-10-CM | POA: Diagnosis not present

## 2022-03-16 DIAGNOSIS — E785 Hyperlipidemia, unspecified: Secondary | ICD-10-CM | POA: Diagnosis not present

## 2022-03-16 DIAGNOSIS — E1169 Type 2 diabetes mellitus with other specified complication: Secondary | ICD-10-CM | POA: Diagnosis not present

## 2022-04-03 DIAGNOSIS — E119 Type 2 diabetes mellitus without complications: Secondary | ICD-10-CM | POA: Diagnosis not present

## 2022-06-09 DIAGNOSIS — E119 Type 2 diabetes mellitus without complications: Secondary | ICD-10-CM | POA: Diagnosis not present

## 2022-07-10 DIAGNOSIS — E119 Type 2 diabetes mellitus without complications: Secondary | ICD-10-CM | POA: Diagnosis not present

## 2022-08-05 DIAGNOSIS — R202 Paresthesia of skin: Secondary | ICD-10-CM | POA: Diagnosis not present

## 2022-08-05 DIAGNOSIS — E119 Type 2 diabetes mellitus without complications: Secondary | ICD-10-CM | POA: Diagnosis not present

## 2022-08-05 DIAGNOSIS — R200 Anesthesia of skin: Secondary | ICD-10-CM | POA: Diagnosis not present

## 2022-08-05 DIAGNOSIS — E1169 Type 2 diabetes mellitus with other specified complication: Secondary | ICD-10-CM | POA: Diagnosis not present

## 2022-08-09 DIAGNOSIS — E119 Type 2 diabetes mellitus without complications: Secondary | ICD-10-CM | POA: Diagnosis not present

## 2022-09-09 DIAGNOSIS — E119 Type 2 diabetes mellitus without complications: Secondary | ICD-10-CM | POA: Diagnosis not present

## 2022-09-29 DIAGNOSIS — E785 Hyperlipidemia, unspecified: Secondary | ICD-10-CM | POA: Diagnosis not present

## 2022-09-29 DIAGNOSIS — Z125 Encounter for screening for malignant neoplasm of prostate: Secondary | ICD-10-CM | POA: Diagnosis not present

## 2022-09-29 DIAGNOSIS — E1169 Type 2 diabetes mellitus with other specified complication: Secondary | ICD-10-CM | POA: Diagnosis not present

## 2022-09-29 DIAGNOSIS — I152 Hypertension secondary to endocrine disorders: Secondary | ICD-10-CM | POA: Diagnosis not present

## 2022-09-29 DIAGNOSIS — Z79899 Other long term (current) drug therapy: Secondary | ICD-10-CM | POA: Diagnosis not present

## 2022-09-29 DIAGNOSIS — Z6841 Body Mass Index (BMI) 40.0 and over, adult: Secondary | ICD-10-CM | POA: Diagnosis not present

## 2022-10-09 DIAGNOSIS — E119 Type 2 diabetes mellitus without complications: Secondary | ICD-10-CM | POA: Diagnosis not present

## 2022-11-09 DIAGNOSIS — E119 Type 2 diabetes mellitus without complications: Secondary | ICD-10-CM | POA: Diagnosis not present

## 2022-12-10 DIAGNOSIS — E119 Type 2 diabetes mellitus without complications: Secondary | ICD-10-CM | POA: Diagnosis not present

## 2022-12-30 DIAGNOSIS — I152 Hypertension secondary to endocrine disorders: Secondary | ICD-10-CM | POA: Diagnosis not present

## 2022-12-30 DIAGNOSIS — Z6841 Body Mass Index (BMI) 40.0 and over, adult: Secondary | ICD-10-CM | POA: Diagnosis not present

## 2022-12-30 DIAGNOSIS — Z0000 Encounter for general adult medical examination without abnormal findings: Secondary | ICD-10-CM | POA: Diagnosis not present

## 2022-12-30 DIAGNOSIS — Z79899 Other long term (current) drug therapy: Secondary | ICD-10-CM | POA: Diagnosis not present

## 2022-12-30 DIAGNOSIS — E1169 Type 2 diabetes mellitus with other specified complication: Secondary | ICD-10-CM | POA: Diagnosis not present

## 2023-01-08 DIAGNOSIS — E119 Type 2 diabetes mellitus without complications: Secondary | ICD-10-CM | POA: Diagnosis not present

## 2023-02-01 ENCOUNTER — Ambulatory Visit: Payer: BC Managed Care – PPO | Admitting: Nurse Practitioner

## 2023-02-01 ENCOUNTER — Encounter: Payer: Self-pay | Admitting: Nurse Practitioner

## 2023-02-01 VITALS — BP 151/81 | HR 81 | Temp 97.7°F | Ht >= 80 in | Wt >= 6400 oz

## 2023-02-01 DIAGNOSIS — E538 Deficiency of other specified B group vitamins: Secondary | ICD-10-CM | POA: Diagnosis not present

## 2023-02-01 DIAGNOSIS — E119 Type 2 diabetes mellitus without complications: Secondary | ICD-10-CM | POA: Diagnosis not present

## 2023-02-01 DIAGNOSIS — Z7985 Long-term (current) use of injectable non-insulin antidiabetic drugs: Secondary | ICD-10-CM

## 2023-02-01 DIAGNOSIS — Z6841 Body Mass Index (BMI) 40.0 and over, adult: Secondary | ICD-10-CM

## 2023-02-01 DIAGNOSIS — Z7984 Long term (current) use of oral hypoglycemic drugs: Secondary | ICD-10-CM

## 2023-02-01 NOTE — Progress Notes (Signed)
Office: [phone]  /  Fax: [phone]   Initial Visit  Steve Rojas was seen in clinic today to evaluate for obesity. He is interested in losing weight to improve overall health and reduce the risk of weight related complications. He presents today to review program treatment options, initial physical assessment, and evaluation.     He was referred by: PCP  When asked what else they would like to accomplish? He states: Improve existing medical conditions, Reduce number of medications, and Lose a target amount of weight : goal weight 300 lbs  Weight history:  He reports that he has always been overweight and has always struggled with his weight.    When asked how has your weight affected you? He states: Has affected self-esteem, Relationships, Contributed to medical problems, Contributed to orthopedic problems or mobility issues, Having fatigue, Having poor endurance, Problems with eating patterns, and Problems with depression and or anxiety  Some associated conditions:  DMT2 (diagnosed in 2002-2003), DJD, OA left knee, B12 def, HLD  Contributing factors: Family history, Stress, and Life event  Weight promoting medications identified: None  Current nutrition plan: He has tried multiple weight loss diets.  He is limiting his carbs and junk food intake.    Current level of physical activity: Walking and Strength training Goal is to start back swimming.  He is also biking 8-12 miles.    Current or previous pharmacotherapy: None  Response to medication: Never tried medications   Past medical history includes:   Past Medical History:  Diagnosis Date   Diabetes ([place])    Hyperlipidemia    Hypochloremia      Objective:   BP (!) 151/81   Pulse 81   Temp 97.7 F (36.5 C)   Ht 6\' 8"  (2.032 m)   Wt (!) 444 lb (201.4 kg)   SpO2 96%   BMI 48.78 kg/m  He was weighed on the bioimpedance scale: Body mass index is 48.78 kg/m.  Peak Weight:450 lbs , Body Fat%:44.8, Visceral  Fat Rating:33, Weight trend over the last 12 months: Increasing  General:  Alert, oriented and cooperative. Patient is in no acute distress.  Respiratory: Normal respiratory effort, no problems with respiration noted   Gait: able to ambulate independently  Mental Status: Normal mood and affect. Normal behavior. Normal judgment and thought content.   DIAGNOSTIC DATA REVIEWED:  BMET No results found for: "NA", "K", "CL", "CO2", "GLUCOSE", "BUN", "CREATININE", "CALCIUM", "GFRNONAA", "GFRAA" No results found for: "HGBA1C" No results found for: "INSULIN" CBC No results found for: "WBC", "RBC", "HGB", "HCT", "PLT", "MCV", "MCH", "MCHC", "RDW" Iron/TIBC/Ferritin/ %Sat No results found for: "IRON", "TIBC", "FERRITIN", "IRONPCTSAT" Lipid Panel  No results found for: "CHOL", "TRIG", "HDL", "CHOLHDL", "VLDL", "LDLCALC", "LDLDIRECT" Hepatic Function Panel  No results found for: "PROT", "ALBUMIN", "AST", "ALT", "ALKPHOS", "BILITOT", "BILIDIR", "IBILI" No results found for: "TSH"   Assessment and Plan:   Type 2 diabetes mellitus without complication, without long-term current use of insulin ([place]) Continue to follow-up with PCP.  Continue medications as directed.  Patient was recently prescribed Ozempic and was not able to take due to cost.  Patient is on Amaryl 4 mg, recently started on Actos and metformin XR 1500 mg daily.  Denies side effects.  He is on a statin and an aspirin 81 mg.  Denies side effects.  B12 deficiency Patient is taking vitamin B12 OTC.  Morbid obesity ([place])  BMI 45.0-49.9, adult ([place])    Obesity Treatment / Action Plan:  Patient will  work on garnering support from family and friends to begin weight loss journey. Will work on eliminating or reducing the presence of highly palatable, calorie dense foods in the home. Will complete provided nutritional and psychosocial assessment questionnaire before the next appointment. Will be scheduled for indirect calorimetry to  determine resting energy expenditure in a fasting state.  This will allow [place] to create a reduced calorie, high-protein meal plan to promote loss of fat mass while preserving muscle mass.  Obesity Education Performed Today:  He was weighed on the bioimpedance scale and results were discussed and documented in the synopsis.  We discussed obesity as a disease and the importance of a more detailed evaluation of all the factors contributing to the disease.  We discussed the importance of long term lifestyle changes which include nutrition, exercise and behavioral modifications as well as the importance of customizing this to his specific health and social needs.  We discussed the benefits of reaching a healthier weight to alleviate the symptoms of existing conditions and reduce the risks of the biomechanical, metabolic and psychological effects of obesity.  Steve Rojas appears to be in the action stage of change and states they are ready to start intensive lifestyle modifications and behavioral modifications.  30 minutes was spent today on this visit including the above counseling, pre-visit chart review, and post-visit documentation.  Reviewed by clinician on day of visit: allergies, medications, problem list, medical history, surgical history, family history, social history, and previous encounter notes pertinent to obesity diagnosis.    Steve Rojas Steve Spainhower FNP-C

## 2023-02-08 DIAGNOSIS — E119 Type 2 diabetes mellitus without complications: Secondary | ICD-10-CM | POA: Diagnosis not present

## 2023-02-18 ENCOUNTER — Ambulatory Visit (INDEPENDENT_AMBULATORY_CARE_PROVIDER_SITE_OTHER): Payer: BC Managed Care – PPO | Admitting: Bariatrics

## 2023-02-18 ENCOUNTER — Encounter: Payer: Self-pay | Admitting: Bariatrics

## 2023-02-18 VITALS — BP 143/89 | HR 73 | Temp 97.7°F | Ht >= 80 in | Wt >= 6400 oz

## 2023-02-18 DIAGNOSIS — R5383 Other fatigue: Secondary | ICD-10-CM

## 2023-02-18 DIAGNOSIS — Z1331 Encounter for screening for depression: Secondary | ICD-10-CM | POA: Diagnosis not present

## 2023-02-18 DIAGNOSIS — E559 Vitamin D deficiency, unspecified: Secondary | ICD-10-CM | POA: Diagnosis not present

## 2023-02-18 DIAGNOSIS — R0602 Shortness of breath: Secondary | ICD-10-CM | POA: Diagnosis not present

## 2023-02-18 DIAGNOSIS — R0683 Snoring: Secondary | ICD-10-CM

## 2023-02-18 DIAGNOSIS — E1169 Type 2 diabetes mellitus with other specified complication: Secondary | ICD-10-CM | POA: Diagnosis not present

## 2023-02-18 DIAGNOSIS — Z0289 Encounter for other administrative examinations: Secondary | ICD-10-CM

## 2023-02-18 DIAGNOSIS — Z7985 Long-term (current) use of injectable non-insulin antidiabetic drugs: Secondary | ICD-10-CM

## 2023-02-18 DIAGNOSIS — E538 Deficiency of other specified B group vitamins: Secondary | ICD-10-CM

## 2023-02-18 DIAGNOSIS — E785 Hyperlipidemia, unspecified: Secondary | ICD-10-CM

## 2023-02-18 DIAGNOSIS — Z6841 Body Mass Index (BMI) 40.0 and over, adult: Secondary | ICD-10-CM

## 2023-02-18 DIAGNOSIS — E119 Type 2 diabetes mellitus without complications: Secondary | ICD-10-CM | POA: Diagnosis not present

## 2023-02-19 LAB — VITAMIN B12: Vitamin B-12: 520 pg/mL (ref 232–1245)

## 2023-02-19 LAB — INSULIN, RANDOM: INSULIN: 9.4 u[IU]/mL (ref 2.6–24.9)

## 2023-02-19 LAB — VITAMIN D 25 HYDROXY (VIT D DEFICIENCY, FRACTURES): Vit D, 25-Hydroxy: 22.5 ng/mL — ABNORMAL LOW (ref 30.0–100.0)

## 2023-02-23 NOTE — Progress Notes (Signed)
Chief Complaint:   OBESITY Steve Rojas (MR# [id number]) is a 62 y.o. male who presents for evaluation and treatment of obesity and related comorbidities. Current BMI is Body mass index is 48.67 kg/m. Steve Rojas has been struggling with his weight for many years and has been unsuccessful in either losing weight, maintaining weight loss, or reaching his healthy weight goal.  Steve Rojas is currently in the action stage of change and ready to dedicate time achieving and maintaining a healthier weight. Blue is interested in becoming our patient and working on intensive lifestyle modifications including (but not limited to) diet and exercise for weight loss.  Steve Rojas's habits were reviewed today and are as follows: His family eats meals together, his desired weight loss is 143 lbs, he has been heavy most of his life, he started gaining weight after he quit smoking, his heaviest weight ever was 450 pounds, he has significant food cravings issues, he snacks frequently in the evenings, he skips meals frequently, he frequently makes poor food choices, he frequently eats larger portions than normal, and he struggles with emotional eating.  Depression Screen Steve Rojas's Food and Mood (modified PHQ-9) score was 15.  Subjective:   1. Other fatigue Steve Rojas admits to daytime somnolence and admits to waking up still tired. Patient has a history of symptoms of daytime fatigue and morning fatigue. Steve Rojas generally gets 7 or 8 hours of sleep per night, and states that he has nightime awakenings. Snoring is present. Apneic episodes are present. Epworth Sleepiness Score is 10.   2. SOB (shortness of breath) on exertion Steve Rojas notes increasing shortness of breath with exercising and seems to be worsening over time with weight gain. He notes getting out of breath sooner with activity than he used to. This has not gotten worse recently. Steve Rojas denies shortness of breath at rest or orthopnea.  3. Type 2 diabetes mellitus without  complication, without long-term current use of insulin Alias was diagnosed in 2007, and he has been on Ozempic, but notes it is too expensive. He is on metformin and Amaryl. Last A1c was 8.4, with no low blood sugar.   4. B12 deficiency Steve Rojas is taking B12, and his last B12 level was 241.  5. Hyperlipidemia associated with type 2 diabetes mellitus Steve Rojas is taking atorvastatin.   6. Snoring Steve Rojas has some apnea, and his neck size is 22 per the patient.   7. Vitamin D deficiency Steve Rojas is not on Vitamin D.   Assessment/Plan:   1. Other fatigue Steve Rojas does feel that his weight is causing his energy to be lower than it should be. Fatigue may be related to obesity, depression or many other causes. Labs will be ordered, and in the meanwhile, Steve Rojas will focus on self care including making healthy food choices, increasing physical activity and focusing on stress reduction.  2. SOB (shortness of breath) on exertion Steve Rojas does feel that he gets out of breath more easily that he used to when he exercises. Steve Rojas's shortness of breath appears to be obesity related and exercise induced. He has agreed to work on weight loss and gradually increase exercise to treat his exercise induced shortness of breath. Will continue to monitor closely.  3. Type 2 diabetes mellitus without complication, without long-term current use of insulin We will check labs today, and we will follow-up at [facility] next visit.   - Insulin, random  4. B12 deficiency We will check labs today, and we will follow-up at [facility] next visit.   -  Vitamin B12  5. Hyperlipidemia associated with type 2 diabetes mellitus Steve Rojas will continue his medications as directed.   6. Snoring Aund[place] has been scheduled for a sleep study.   - Ambulatory referral to Sleep Studies  7. Vitamin D deficiency We will check labs today, and we will follow-up at [facility] next visit.   - VITAMIN D 25 Hydroxy (Vit-D Deficiency, Fractures)  8. Depression  screening Shanan had a positive depression screening. Depression is commonly associated with obesity and often results in emotional eating behaviors. We will monitor this closely and work on CBT to help improve the non-hunger eating patterns. Referral to Psychology may be required if no improvement is seen as he continues in our clinic.  9. Morbid obesity  10. BMI 45.0-49.9, adult Steve Rojas is currently in the action stage of change and his goal is to continue with weight loss efforts. I recommend Steve Rojas begin the structured treatment plan as follows:  He has agreed to the Category 4 Plan + 200 calories and keeping a food journal and adhering to recommended goals of 2000 calories and 120-140 grams of protein.  Meal planning. Reviewed labs with the patient today, 12/30/2022 A1c 8.4 (11.6). B12, PSA 10/01/2022, CBC, CMP, lipid, and TSH. Decrease portion size, and will slow down his eating.  Exercise goals: No exercise has been prescribed at this time.   Behavioral modification strategies: increasing lean protein intake, decreasing simple carbohydrates, increasing vegetables, increasing water intake, decreasing eating out, no skipping meals, meal planning and cooking strategies, and keeping healthy foods in the home.  He was informed of the importance of frequent follow-up visits to maximize his success with intensive lifestyle modifications for his multiple health conditions. He was informed we would discuss his lab results at his next visit unless there is a critical issue that needs to be addressed sooner. Steve Rojas agreed to keep his next visit at the agreed upon time to discuss these results.  Objective:   Blood pressure (!) 143/89, pulse 73, temperature 97.7 F (36.5 C), height  (2.032 m), weight (!) 443 lb (200.9 kg), SpO2 96 %. Body mass index is 48.67 kg/m.  EKG: Normal sinus rhythm, rate (Unable to obtain).  Indirect Calorimeter completed today shows a VO2 of 471 and a REE of 3254.  His  calculated basal metabolic rate is (unable to determine) thus his basal metabolic rate is  (unable to determine)  than expected.  General: Cooperative, alert, well developed, in no acute distress. HEENT: Conjunctivae and lids unremarkable. Cardiovascular: Regular rhythm.  Lungs: Normal work of breathing. Neurologic: No focal deficits.   No results found for: "CREATININE", "BUN", "NA", "K", "CL", "CO2" No results found for: "ALT", "AST", "GGT", "ALKPHOS", "BILITOT" No results found for: "HGBA1C" Lab Results  Component Value Date   INSULIN 9.4 02/18/2023   No results found for: "TSH" No results found for: "CHOL", "HDL", "LDLCALC", "LDLDIRECT", "TRIG", "CHOLHDL" No results found for: "WBC", "HGB", "HCT", "MCV", "PLT" No results found for: "IRON", "TIBC", "FERRITIN"  Attestation Statements:   Reviewed by clinician on day of visit: allergies, medications, problem list, medical history, surgical history, family history, social history, and previous encounter notes.   Time spent on visit including pre-visit chart review and post-visit charting and care was 50 minutes.    Trude Mcburney, am acting as Energy manager for [employer], DO.  I have reviewed the above documentation for accuracy and completeness, and I agree with the above. - Theodoro Grist, DO

## 2023-03-04 ENCOUNTER — Ambulatory Visit: Payer: BC Managed Care – PPO | Admitting: Nurse Practitioner

## 2023-03-04 ENCOUNTER — Encounter: Payer: Self-pay | Admitting: Nurse Practitioner

## 2023-03-04 VITALS — BP 165/89 | HR 62 | Temp 97.6°F | Ht >= 80 in | Wt >= 6400 oz

## 2023-03-04 DIAGNOSIS — E1159 Type 2 diabetes mellitus with other circulatory complications: Secondary | ICD-10-CM | POA: Diagnosis not present

## 2023-03-04 DIAGNOSIS — I152 Hypertension secondary to endocrine disorders: Secondary | ICD-10-CM

## 2023-03-04 DIAGNOSIS — E119 Type 2 diabetes mellitus without complications: Secondary | ICD-10-CM

## 2023-03-04 DIAGNOSIS — Z7985 Long-term (current) use of injectable non-insulin antidiabetic drugs: Secondary | ICD-10-CM

## 2023-03-04 DIAGNOSIS — Z6841 Body Mass Index (BMI) 40.0 and over, adult: Secondary | ICD-10-CM

## 2023-03-04 DIAGNOSIS — E559 Vitamin D deficiency, unspecified: Secondary | ICD-10-CM

## 2023-03-04 MED ORDER — VITAMIN D (ERGOCALCIFEROL) 1.25 MG (50000 UNIT) PO CAPS
50000.0000 [IU] | ORAL_CAPSULE | ORAL | 0 refills | Status: DC
Start: 2023-03-04 — End: 2023-03-24

## 2023-03-04 MED ORDER — OZEMPIC (0.25 OR 0.5 MG/DOSE) 2 MG/3ML ~~LOC~~ SOPN
0.2500 mg | PEN_INJECTOR | SUBCUTANEOUS | 0 refills | Status: DC
Start: 2023-03-04 — End: 2023-04-06

## 2023-03-04 NOTE — Progress Notes (Deleted)
Office: [phone]  /  Fax: [phone]  WEIGHT SUMMARY AND BIOMETRICS  No data recorded No data recorded  No data recorded Anthropometric Measurements Height:  (2.032 m) Starting Weight: 443lb   No data recorded Other Clinical Data Today's Visit #: 2 Starting Date: 02/18/23     HPI  Chief Complaint: OBESITY  Steve Rojas is here to discuss his progress with his obesity treatment plan. He is on the keeping a food journal and adhering to recommended goals of 2000 calories and 120-140 protein and states he is following his eating plan approximately 25 % of the time. He states he is exercising 25-30 minutes 1-2 days per week.   Interval History:  Since last office visit he ***   Pharmacotherapy for weight loss: He {srtis (Optional):29129} currently taking {srtpreviousweightlossmeds (Optional):29124} for medical weight loss.  Denies side effects.    Previous pharmacotherapy for medical weight loss:  {srtpreviousweightlossmeds (Optional):29124}  He stopped *** due to side effects of ***.  Bariatric surgery:  Patient is status post {srtweightlosssurgery:29125} by Dr.  Marland Kitchen in ***.  His highest weight prior to surgery was *** and his nadir weight after surgery was ***.  He is taking {srtvitamins (Optional):29126}.  He reports {srtrestriction (Optional):29127} restriction.     PHYSICAL EXAM:  Height  (2.032 m). Body mass index is 48.67 kg/m.  General: He is overweight, cooperative, alert, well developed, and in no acute distress. PSYCH: Has normal mood, affect and thought process.   Extremities: No edema.  Neurologic: No gross sensory or motor deficits. No tremors or fasciculations noted.    DIAGNOSTIC DATA REVIEWED:  BMET No results found for: "NA", "K", "CL", "CO2", "GLUCOSE", "BUN", "CREATININE", "CALCIUM", "GFRNONAA", "GFRAA" No results found for: "HGBA1C" Lab Results  Component Value Date   INSULIN 9.4 02/18/2023   No results found for: "TSH" CBC No  results found for: "WBC", "RBC", "HGB", "HCT", "PLT", "MCV", "MCH", "MCHC", "RDW" Iron Studies No results found for: "IRON", "TIBC", "FERRITIN", "IRONPCTSAT" Lipid Panel  No results found for: "CHOL", "TRIG", "HDL", "CHOLHDL", "VLDL", "LDLCALC", "LDLDIRECT" Hepatic Function Panel  No results found for: "PROT", "ALBUMIN", "AST", "ALT", "ALKPHOS", "BILITOT", "BILIDIR", "IBILI" No results found for: "TSH" Nutritional Lab Results  Component Value Date   VD25OH 22.5 (L) 02/18/2023     ASSESSMENT AND PLAN  TREATMENT PLAN FOR OBESITY:  Recommended Dietary Goals  Steve Rojas is currently in the action stage of change. As such, his goal is to continue weight management plan. He has agreed to {MWMwtlossportion/plan2:23431}.  Behavioral Intervention  We discussed the following Behavioral Modification Strategies today: {EMWMwtlossstrategies:28914::"increasing lean protein intake","decreasing simple carbohydrates ","increasing vegetables","increasing lower glycemic fruits","increasing water intake","continue to practice mindfulness when eating","planning for success"}.  Additional resources provided today: NA  Recommended Physical Activity Goals  Steve Rojas has been advised to work up to 150 minutes of moderate intensity aerobic activity a week and strengthening exercises 2-3 times per week for cardiovascular health, weight loss maintenance and preservation of muscle mass.   He has agreed to {EMEXERCISE:28847}   Pharmacotherapy We discussed various medication options to help Steve Rojas with his weight loss efforts and we both agreed to ***.  ASSOCIATED CONDITIONS ADDRESSED TODAY  Action/Plan  There are no diagnoses linked to this encounter.       No follow-ups on file.Marland Kitchen He was informed of the importance of frequent follow up visits to maximize his success with intensive lifestyle modifications for his multiple health conditions.   ATTESTASTION STATEMENTS:  Reviewed by clinician on day of  visit: allergies, medications, problem list, medical history, surgical history, family history, social history, and previous encounter notes.   Time spent on visit including pre-visit chart review and post-visit care and charting was *** minutes.    Steve Rojas. [place] FNP-C

## 2023-03-04 NOTE — Progress Notes (Signed)
Office: [phone]  /  Fax: [phone]  WEIGHT SUMMARY AND BIOMETRICS  Weight Gained Since Last Visit: 6lb   Vitals Temp: 97.6 F (36.4 C) BP: (!) 165/89 Pulse Rate: 62 SpO2: 97 %   Anthropometric Measurements Height:  (2.032 m) Weight: (!) 449 lb (203.7 kg) BMI (Calculated): 49.33 Weight at Last Visit: 443lb Weight Lost Since Last Visit: 0 Weight Gained Since Last Visit: 6lb Starting Weight: 443lb Total Weight Loss (lbs): 0 lb (0 kg)   No data recorded Other Clinical Data Fasting: no Labs: no Today's Visit #: 2 Starting Date: 02/18/23     HPI  Chief Complaint: OBESITY  Steve Rojas is here to discuss his progress with his obesity treatment plan. He is on the keeping a food journal and adhering to recommended goals of 2000 calories and 120-140 protein and states he is following his eating plan approximately 25 % of the time. He states he is exercising 25-30 minutes 1-2 days per week.   Interval History:  Since last office visit he has gained 6 pounds.  Since his last visit he went back home to [place] to see his mother who is not doing well.  His wife lost both of her brothers.  Due to life events, he hasn't been able to focus on himself.      Pharmacotherapy for weight loss: He is not currently taking medications  for medical weight loss.    Previous pharmacotherapy for medical weight loss:  none  Bariatric surgery:  Patient has not had bariatric surgery.   Pharmacotherapy for DMT2:  He is currently taking Metformin XR  3 daily and Amaryl .  Denies side effects.   Last A1c was 8.4 CBGs: Fasting 140-180 Episodes of hypoglycemia: no On ASA  and statin.  Last eye exam:  2024 He has tried Ozempic in the past but was unable to continue due to cost    No results found for: "HGBA1C" No results found for: "GLUF", "MICROALBUR", "LDLCALC", "CREATININE"   Hypertension Hypertension poorly controlled.  Medication(s): was on meds in the past and  was stopped for unknown reason. Can't remember what he took in the past.   Denies chest pain, palpitations and SOB. FH:  father  BP Readings from Last 3 Encounters:  03/04/23 (!) 165/89  02/18/23 (!) 143/89  02/01/23 (!) 151/81   No results found for: "CREATININE"     PHYSICAL EXAM:  Blood pressure (!) 165/89, pulse 62, temperature 97.6 F (36.4 C), height  (2.032 m), weight (!) 449 lb (203.7 kg), SpO2 97 %. Body mass index is 49.33 kg/m.  General: He is overweight, cooperative, alert, well developed, and in no acute distress. PSYCH: Has normal mood, affect and thought process.   Extremities: No edema.  Neurologic: No gross sensory or motor deficits. No tremors or fasciculations noted.    DIAGNOSTIC DATA REVIEWED:  BMET No results found for: "NA", "K", "CL", "CO2", "GLUCOSE", "BUN", "CREATININE", "CALCIUM", "GFRNONAA", "GFRAA" No results found for: "HGBA1C" Lab Results  Component Value Date   INSULIN 9.4 02/18/2023   No results found for: "TSH" CBC No results found for: "WBC", "RBC", "HGB", "HCT", "PLT", "MCV", "MCH", "MCHC", "RDW" Iron Studies No results found for: "IRON", "TIBC", "FERRITIN", "IRONPCTSAT" Lipid Panel  No results found for: "CHOL", "TRIG", "HDL", "CHOLHDL", "VLDL", "LDLCALC", "LDLDIRECT" Hepatic Function Panel  No results found for: "PROT", "ALBUMIN", "AST", "ALT", "ALKPHOS", "BILITOT", "BILIDIR", "IBILI" No results found for: "TSH" Nutritional Lab Results  Component Value Date   VD25OH 22.5 (  L) 02/18/2023     ASSESSMENT AND PLAN  TREATMENT PLAN FOR OBESITY:  Recommended Dietary Goals  Steve Rojas is currently in the action stage of change. As such, his goal is to continue weight management plan. He has agreed to to use app to start tracking.  Behavioral Intervention  We discussed the following Behavioral Modification Strategies today: increasing lean protein intake, decreasing simple carbohydrates , increasing vegetables, increasing lower  glycemic fruits, increasing fiber rich foods, avoiding skipping meals, increasing water intake, continue to practice mindfulness when eating, and planning for success.  Additional resources provided today: NA  Recommended Physical Activity Goals  Steve Rojas has been advised to work up to 150 minutes of moderate intensity aerobic activity a week and strengthening exercises 2-3 times per week for cardiovascular health, weight loss maintenance and preservation of muscle mass.   He has agreed to Think about ways to increase physical activity, Increase physical activity in their day and reduce sedentary time (increase NEAT)., and Work on scheduling and tracking physical activity.    Pharmacotherapy We discussed various medication options to help Steve Rojas with his weight loss efforts and we both agreed to start Ozempic 0.25mg  for DMT2.  Side effects discussed.  Took in the past but had to stop due to cost.  Contraindications:  Pancreatitis (active gallstones) Medullary thyroid cancer High triglycerides (>500)-will need labs prior to starting Multiple Endocrine Neoplasia syndrome type 2 (MEN 2) Trying to get pregnant Breastfeeding Use with caution with taking insulin or sulfonylureas (will need to monitor blood sugars for hypoglycemia)  ASSOCIATED CONDITIONS ADDRESSED TODAY  Action/Plan  Vitamin D deficiency -     Vitamin D (Ergocalciferol); Take 1 capsule (50,000 Units total) by mouth every 7 (seven) days.  Dispense: 5 capsule; Refill: 0  Low Vitamin D level contributes to fatigue and are associated with obesity, breast, and colon cancer. He agrees to continue to take prescription Vitamin D ,000 IU every week and will follow-up for routine testing of Vitamin D, at least 2-3 times per year to avoid over-replacement.   Type 2 diabetes mellitus without complication, without long-term current use of insulin -     Start Ozempic (0.25 or 0.5 MG/DOSE); Inject 0.25 mg into the skin once a week.   Dispense: 3 mL; Refill: 0. Side effects discussed.  Glucose log sheet given to patient.  Will discuss at next visit.  Will adjust other medications as needed.   Good blood sugar control is important to decrease the likelihood of diabetic complications such as nephropathy, neuropathy, limb loss, blindness, coronary artery disease, and death. Intensive lifestyle modification including diet, exercise and weight loss are the first line of treatment for diabetes.    Hypertension associated with diabetes BP has been elevated on multiple occasions.  Patient is not currently taking any medications.  He is to contact PCP today to schedule an appointment to discuss treatment options.  Would benefit from being on ACE or ARB due to diabetes.  Morbid obesity  BMI 45.0-49.9, adult      Labs reviewed in chart from 02/18/23   Return in about 3 weeks (around 03/25/2023).Marland Kitchen He was informed of the importance of frequent follow up visits to maximize his success with intensive lifestyle modifications for his multiple health conditions.   ATTESTASTION STATEMENTS:  Reviewed by clinician on day of visit: allergies, medications, problem list, medical history, surgical history, family history, social history, and previous encounter notes.    Theodis Sato. Calahan Pak FNP-C

## 2023-03-04 NOTE — Patient Instructions (Signed)

## 2023-03-10 DIAGNOSIS — E119 Type 2 diabetes mellitus without complications: Secondary | ICD-10-CM | POA: Diagnosis not present

## 2023-03-22 DIAGNOSIS — Z6841 Body Mass Index (BMI) 40.0 and over, adult: Secondary | ICD-10-CM | POA: Diagnosis not present

## 2023-03-22 DIAGNOSIS — E1169 Type 2 diabetes mellitus with other specified complication: Secondary | ICD-10-CM | POA: Diagnosis not present

## 2023-03-22 DIAGNOSIS — I152 Hypertension secondary to endocrine disorders: Secondary | ICD-10-CM | POA: Diagnosis not present

## 2023-03-22 DIAGNOSIS — Z79899 Other long term (current) drug therapy: Secondary | ICD-10-CM | POA: Diagnosis not present

## 2023-03-24 ENCOUNTER — Ambulatory Visit: Payer: BC Managed Care – PPO | Admitting: Nurse Practitioner

## 2023-03-24 ENCOUNTER — Encounter: Payer: Self-pay | Admitting: Nurse Practitioner

## 2023-03-24 VITALS — BP 131/76 | HR 92 | Temp 98.0°F | Ht >= 80 in | Wt >= 6400 oz

## 2023-03-24 DIAGNOSIS — E119 Type 2 diabetes mellitus without complications: Secondary | ICD-10-CM

## 2023-03-24 DIAGNOSIS — Z7984 Long term (current) use of oral hypoglycemic drugs: Secondary | ICD-10-CM

## 2023-03-24 DIAGNOSIS — E559 Vitamin D deficiency, unspecified: Secondary | ICD-10-CM

## 2023-03-24 DIAGNOSIS — Z6841 Body Mass Index (BMI) 40.0 and over, adult: Secondary | ICD-10-CM | POA: Diagnosis not present

## 2023-03-24 DIAGNOSIS — Z7985 Long-term (current) use of injectable non-insulin antidiabetic drugs: Secondary | ICD-10-CM

## 2023-03-24 MED ORDER — VITAMIN D (ERGOCALCIFEROL) 1.25 MG (50000 UNIT) PO CAPS
50000.0000 [IU] | ORAL_CAPSULE | ORAL | 0 refills | Status: DC
Start: 2023-03-24 — End: 2023-04-21

## 2023-03-24 NOTE — Patient Instructions (Signed)
Thank you for enrolling in MyChart. Please follow the instructions below to securely access your online medical record. MyChart allows you to send messages to your doctor, view your test results, renew your prescriptions, schedule appointments, and more.  How Do I Sign Up? In your Internet browser, go to [email or web address] Click on the New  User? link in the Sign In box.  Enter your MyChart Access Code exactly as it appears below. You will not need to use this code after you have completed the sign-up process. If you do not sign up before the expiration date, you must request a new code. MyChart Access Code: [email or web address] Expires: 04/18/2023  8:50 AM  Enter the last four digits of your Social Security Number (xxxx) and Date of Birth (mm/dd/yyyy) as indicated and click Next. You will be taken to the next sign-up page. Create a MyChart ID. This will be your MyChart login ID and cannot be changed, so think of one that is secure and easy to remember. Create a Clinical biochemist. You can change your password at any time. Enter your Password Reset Question and Answer and click Next. This can be used at a later time if you forget your password.  Select your communication preference, and if applicable enter your e-mail address. You will receive e-mail notification when new information is available in MyChart by choosing to receive e-mail notifications and filling in your e-mail. Click Sign In. You can now view your medical record.   Additional Information If you have questions, you can email [email or web address] or call [phone] to talk to our MyChart staff. Remember, MyChart is NOT to be used for urgent needs. For medical emergencies, dial 911.

## 2023-03-24 NOTE — Progress Notes (Signed)
Office: [phone]  /  Fax: [phone]  WEIGHT SUMMARY AND BIOMETRICS  Weight Lost Since Last Visit: 13lb  No data recorded  Vitals Temp: 98 F (36.7 C) BP: 131/76 Pulse Rate: 92 SpO2: 95 %   Anthropometric Measurements Height: 6\' 8"  (2.032 m) Weight: (!) 436 lb (197.8 kg) BMI (Calculated): 47.9 Weight at Last Visit: 449lb Weight Lost Since Last Visit: 13lb Starting Weight: 443lb Total Weight Loss (lbs): 7 lb (3.175 kg)   Body Composition  Body Fat %: 44 % Fat Mass (lbs): 194.6 lbs Muscle Mass (lbs): 230 lbs Total Body Water (lbs): 175.8 lbs Visceral Fat Rating : 32   Other Clinical Data Fasting: No Labs: No Today's Visit #: 3 Starting Date: 02/18/23     HPI  Chief Complaint: OBESITY  Steve Rojas is here to discuss his progress with his obesity treatment plan. He is on the keeping a food journal and adhering to recommended goals of 2000 calories and 120-140 protein and states he is following his eating plan approximately 100 % of the time. He states he is exercising 60 minutes 5 days per week-started exercising more since May 1st.   Interval History:  Since last office visit he has lost 13 pounds.  Reports feeling good, notes some hunger and cravings. If he gets hungry, it's in the am. If he does get hungry, he will eat nuts, atkins snacks or cottage cheese with jam.  He is using [employer] app and is averaging around 1220-2200 calories, carbs 35-94 grams and protein 109-200 grams.  He is drinking water with flavoring and diet coke daily.     Pharmacotherapy for weight loss: He is not currently taking medications  for medical weight loss.    Previous pharmacotherapy for medical weight loss:  none  Bariatric surgery:  Patient has not had bariatric surgery  Pharmacotherapy for DMT2:  He is currently taking glimepiride 4mg , metformin XR 500mg  3 q HS and Ozempic 0.25mg  (taken 2 doses-notes some nausea after injection).     Last A1c was 8.4 CBGs: Fasting  131-177, before lunch 104-149 and before dinner 95-121    Episodes of hypoglycemia: no On ACE or ARB, ASA 81mg  and statin.  Last eye exam:  2024 He has tried Ozempic in the past.    No results found for: "HGBA1C" No results found for: "GLUF", "MICROALBUR", "LDLCALC", "CREATININE"   Vit D deficiency  He is taking Vit D 50,000 IU weekly.  Denies side effects.  Denies nausea, vomiting or muscle weakness.    Lab Results  Component Value Date   VD25OH 22.5 (L) 02/18/2023     PHYSICAL EXAM:  Blood pressure 131/76, pulse 92, temperature 98 F (36.7 C), height 6\' 8"  (2.032 m), weight (!) 436 lb (197.8 kg), SpO2 95 %. Body mass index is 47.9 kg/m.  General: He is overweight, cooperative, alert, well developed, and in no acute distress. PSYCH: Has normal mood, affect and thought process.   Extremities: No edema.  Neurologic: No gross sensory or motor deficits. No tremors or fasciculations noted.    DIAGNOSTIC DATA REVIEWED:  BMET No results found for: "NA", "K", "CL", "CO2", "GLUCOSE", "BUN", "CREATININE", "CALCIUM", "GFRNONAA", "GFRAA" No results found for: "HGBA1C" Lab Results  Component Value Date   INSULIN 9.4 02/18/2023   No results found for: "TSH" CBC No results found for: "WBC", "RBC", "HGB", "HCT", "PLT", "MCV", "MCH", "MCHC", "RDW" Iron Studies No results found for: "IRON", "TIBC", "FERRITIN", "IRONPCTSAT" Lipid Panel  No results found for: "CHOL", "TRIG", "  HDL", "CHOLHDL", "VLDL", "LDLCALC", "LDLDIRECT" Hepatic Function Panel  No results found for: "PROT", "ALBUMIN", "AST", "ALT", "ALKPHOS", "BILITOT", "BILIDIR", "IBILI" No results found for: "TSH" Nutritional Lab Results  Component Value Date   VD25OH 22.5 (L) 02/18/2023     ASSESSMENT AND PLAN  TREATMENT PLAN FOR OBESITY:  Recommended Dietary Goals  Steve Rojas is currently in the action stage of change. As such, his goal is to continue weight management plan. He has agreed to keeping a food journal and  adhering to recommended goals of 2000 calories and 120-140 protein.  Behavioral Intervention  We discussed the following Behavioral Modification Strategies today: increasing lean protein intake, decreasing simple carbohydrates , increasing vegetables, increasing lower glycemic fruits, increasing fiber rich foods, avoiding skipping meals, increasing water intake, continue to practice mindfulness when eating, and planning for success.  Additional resources provided today: NA  Recommended Physical Activity Goals  Steve Rojas has been advised to work up to 150 minutes of moderate intensity aerobic activity a week and strengthening exercises 2-3 times per week for cardiovascular health, weight loss maintenance and preservation of muscle mass.   He has agreed to Continue current level of physical activity     ASSOCIATED CONDITIONS ADDRESSED TODAY  Action/Plan  Type 2 diabetes mellitus without complication, without long-term current use of insulin (HCC) Continue meds as directed Take Ozempic 0.25mg  for the next 2 weeks and then increase to 0.5mg . side effects discussed To continue monitoring BS.  Will send to me via mychart if any lows or significant changes  Vitamin D deficiency -     Vitamin D (Ergocalciferol); Take 1 capsule (50,000 Units total) by mouth every 7 (seven) days.  Dispense: 5 capsule; Refill: 0  Morbid obesity (HCC) Continue tracking calories, carbs, protein.  Will review at next visit  BMI 45.0-49.9, adult [facility])      Will check labs in July.    Return in about 4 weeks (around 04/21/2023).Marland Kitchen He was informed of the importance of frequent follow up visits to maximize his success with intensive lifestyle modifications for his multiple health conditions.   ATTESTASTION STATEMENTS:  Reviewed by clinician on day of visit: allergies, medications, problem list, medical history, surgical history, family history, social history, and previous encounter notes.     Steve Rojas.  Feliza Diven FNP-C

## 2023-04-05 ENCOUNTER — Other Ambulatory Visit: Payer: Self-pay | Admitting: Nurse Practitioner

## 2023-04-05 DIAGNOSIS — E119 Type 2 diabetes mellitus without complications: Secondary | ICD-10-CM

## 2023-04-10 DIAGNOSIS — E119 Type 2 diabetes mellitus without complications: Secondary | ICD-10-CM | POA: Diagnosis not present

## 2023-04-21 ENCOUNTER — Encounter: Payer: Self-pay | Admitting: Nurse Practitioner

## 2023-04-21 ENCOUNTER — Ambulatory Visit: Payer: BC Managed Care – PPO | Admitting: Nurse Practitioner

## 2023-04-21 VITALS — BP 143/84 | HR 88 | Temp 97.6°F | Ht >= 80 in | Wt >= 6400 oz

## 2023-04-21 DIAGNOSIS — E119 Type 2 diabetes mellitus without complications: Secondary | ICD-10-CM

## 2023-04-21 DIAGNOSIS — Z6841 Body Mass Index (BMI) 40.0 and over, adult: Secondary | ICD-10-CM

## 2023-04-21 DIAGNOSIS — Z7985 Long-term (current) use of injectable non-insulin antidiabetic drugs: Secondary | ICD-10-CM

## 2023-04-21 DIAGNOSIS — E559 Vitamin D deficiency, unspecified: Secondary | ICD-10-CM

## 2023-04-21 MED ORDER — VITAMIN D (ERGOCALCIFEROL) 1.25 MG (50000 UNIT) PO CAPS: 50000.0000 [IU] | ORAL_CAPSULE | ORAL | 0 refills | Status: DC

## 2023-04-21 NOTE — Progress Notes (Signed)
Office: [phone]  /  Fax: [phone]  WEIGHT SUMMARY AND BIOMETRICS  Weight Lost Since Last Visit: 5lb  No data recorded  Vitals Temp: 97.6 F (36.4 C) BP: (!) 143/84 Pulse Rate: 88 SpO2: 96 %   Anthropometric Measurements Height: 6\' 8"  (2.032 m) Weight: (!) 431 lb (195.5 kg) BMI (Calculated): 47.35 Weight at Last Visit: 436lb Weight Lost Since Last Visit: 5lb Starting Weight: 443lb Total Weight Loss (lbs): 12 lb (5.443 kg)   Body Composition  Body Fat %: 44.6 % Fat Mass (lbs): 192.2 lbs Muscle Mass (lbs): 227.2 lbs Total Body Water (lbs): 179 lbs Visceral Fat Rating : 32   Other Clinical Data Fasting: No Labs: No Today's Visit #: 4 Starting Date: 02/18/23     HPI  Chief Complaint: OBESITY  Steve Rojas is here to discuss his progress with his obesity treatment plan. He is on the keeping a food journal and adhering to recommended goals of 2000 calories and 120-140 protein and states he is following his eating plan approximately 100 % of the time. He states he is exercising 60 minutes 4-5 days per week-biking, resistance training and swimming.   Interval History:  Since last office visit he has lost 5 pounds.  He is averaging around 1288-1758 calories, 92-143 grams of protein and carbs 39-97.  He is drinking water with flavoring and diet coke daily.    Pharmacotherapy for weight loss: He is not currently taking medications  for medical weight loss.    Previous pharmacotherapy for medical weight loss:  has not tried weight loss meds in the past.   Bariatric surgery:  Has not had bariatric surgery.    Pharmacotherapy for DMT2:  He is currently taking glimepiride 4mg , metformin XR 500mg  3 q HS and Ozempic 0.5mg  (taken 2 dose of 0.5mg ).  Denies side effects.   Last A1c was 8.4 CBGs: Fasting 121-177, 2 hour PP 92-202   Episodes of hypoglycemia: no On ASA 81mg  and statin.  Last eye exam:  2024 He has tried Ozempic in the past.    No results found for:  "HGBA1C" No results found for: "GLUF", "MICROALBUR", "LDLCALC", "CREATININE"     PHYSICAL EXAM:  Blood pressure (!) 143/84, pulse 88, temperature 97.6 F (36.4 C), height 6\' 8"  (2.032 m), weight (!) 431 lb (195.5 kg), SpO2 96 %. Body mass index is 47.35 kg/m.  General: He is overweight, cooperative, alert, well developed, and in no acute distress. PSYCH: Has normal mood, affect and thought process.   Extremities: No edema.  Neurologic: No gross sensory or motor deficits. No tremors or fasciculations noted.    DIAGNOSTIC DATA REVIEWED:  BMET No results found for: "NA", "K", "CL", "CO2", "GLUCOSE", "BUN", "CREATININE", "CALCIUM", "GFRNONAA", "GFRAA" No results found for: "HGBA1C" Lab Results  Component Value Date   INSULIN 9.4 02/18/2023   No results found for: "TSH" CBC No results found for: "WBC", "RBC", "HGB", "HCT", "PLT", "MCV", "MCH", "MCHC", "RDW" Iron Studies No results found for: "IRON", "TIBC", "FERRITIN", "IRONPCTSAT" Lipid Panel  No results found for: "CHOL", "TRIG", "HDL", "CHOLHDL", "VLDL", "LDLCALC", "LDLDIRECT" Hepatic Function Panel  No results found for: "PROT", "ALBUMIN", "AST", "ALT", "ALKPHOS", "BILITOT", "BILIDIR", "IBILI" No results found for: "TSH" Nutritional Lab Results  Component Value Date   VD25OH 22.5 (L) 02/18/2023     ASSESSMENT AND PLAN  TREATMENT PLAN FOR OBESITY:  Recommended Dietary Goals  Steve Rojas is currently in the action stage of change. As such, his goal is to continue weight management plan. He  has agreed to keeping a food journal and adhering to recommended goals of 2000 calories and >100 grams of protein. He continues to struggle with not eating enough calories and protein.  We discussed this extensively today.    Behavioral Intervention  We discussed the following Behavioral Modification Strategies today: increasing lean protein intake, decreasing simple carbohydrates , increasing vegetables, increasing lower glycemic  fruits, increasing water intake, work on meal planning and preparation, work on Counselling psychologist calories using tracking application, keeping healthy foods at home, continue to practice mindfulness when eating, and planning for success.  Additional resources provided today: NA  Recommended Physical Activity Goals  Steve Rojas has been advised to work up to 150 minutes of moderate intensity aerobic activity a week and strengthening exercises 2-3 times per week for cardiovascular health, weight loss maintenance and preservation of muscle mass.   He has agreed to Continue current level of physical activity    ASSOCIATED CONDITIONS ADDRESSED TODAY  Action/Plan  Type 2 diabetes mellitus without complication, without long-term current use of insulin (HCC) Continue Ozempic 0.5mg  until next visit.  Side effects discussed Will continue to track and monitoring calories, carbs and protein.  To review at next visit.    Vitamin D deficiency -     Vitamin D (Ergocalciferol); Take 1 capsule (50,000 Units total) by mouth every 7 (seven) days.  Dispense: 5 capsule; Refill: 0.  Side effects discussed.    Morbid obesity (HCC)  BMI 45.0-49.9, adult (HCC)         Return in about 4 weeks (around 05/19/2023).Marland Kitchen He was informed of the importance of frequent follow up visits to maximize his success with intensive lifestyle modifications for his multiple health conditions.   ATTESTASTION STATEMENTS:  Reviewed by clinician on day of visit: allergies, medications, problem list, medical history, surgical history, family history, social history, and previous encounter notes.    Steve Rojas. Steve Mellen FNP-C

## 2023-05-10 DIAGNOSIS — E119 Type 2 diabetes mellitus without complications: Secondary | ICD-10-CM | POA: Diagnosis not present

## 2023-05-18 ENCOUNTER — Other Ambulatory Visit: Payer: Self-pay | Admitting: Nurse Practitioner

## 2023-05-18 DIAGNOSIS — E559 Vitamin D deficiency, unspecified: Secondary | ICD-10-CM

## 2023-05-19 ENCOUNTER — Encounter: Payer: Self-pay | Admitting: Nurse Practitioner

## 2023-05-19 ENCOUNTER — Ambulatory Visit: Payer: BC Managed Care – PPO | Admitting: Nurse Practitioner

## 2023-05-19 VITALS — BP 141/80 | HR 65 | Temp 97.6°F | Ht >= 80 in | Wt >= 6400 oz

## 2023-05-19 DIAGNOSIS — E669 Obesity, unspecified: Secondary | ICD-10-CM

## 2023-05-19 DIAGNOSIS — Z6841 Body Mass Index (BMI) 40.0 and over, adult: Secondary | ICD-10-CM

## 2023-05-19 DIAGNOSIS — I10 Essential (primary) hypertension: Secondary | ICD-10-CM

## 2023-05-19 DIAGNOSIS — Z7985 Long-term (current) use of injectable non-insulin antidiabetic drugs: Secondary | ICD-10-CM

## 2023-05-19 DIAGNOSIS — Z7984 Long term (current) use of oral hypoglycemic drugs: Secondary | ICD-10-CM

## 2023-05-19 DIAGNOSIS — E559 Vitamin D deficiency, unspecified: Secondary | ICD-10-CM

## 2023-05-19 DIAGNOSIS — E119 Type 2 diabetes mellitus without complications: Secondary | ICD-10-CM | POA: Diagnosis not present

## 2023-05-19 DIAGNOSIS — E1159 Type 2 diabetes mellitus with other circulatory complications: Secondary | ICD-10-CM

## 2023-05-19 MED ORDER — VITAMIN D (ERGOCALCIFEROL) 1.25 MG (50000 UNIT) PO CAPS
50000.0000 [IU] | ORAL_CAPSULE | ORAL | 0 refills | Status: DC
Start: 2023-05-19 — End: 2023-10-12

## 2023-05-19 MED ORDER — SEMAGLUTIDE (1 MG/DOSE) 4 MG/3ML ~~LOC~~ SOPN
1.0000 mg | PEN_INJECTOR | SUBCUTANEOUS | 0 refills | Status: DC
Start: 2023-05-19 — End: 2023-06-22

## 2023-05-19 NOTE — Progress Notes (Signed)
Office: [phone]  /  Fax: [phone]  WEIGHT SUMMARY AND BIOMETRICS  Weight Lost Since Last Visit: 7lb  Weight Gained Since Last Visit: 0lb   Vitals Temp: 97.6 F (36.4 C) BP: (!) 150/84 Pulse Rate: 65 SpO2: 95 %   Anthropometric Measurements Height: 6\' 8"  (2.032 m) Weight: (!) 424 lb (192.3 kg) BMI (Calculated): 46.58 Weight at Last Visit: 431lb Weight Lost Since Last Visit: 7lb Weight Gained Since Last Visit: 0lb Starting Weight: 443lb Total Weight Loss (lbs): 19 lb (8.618 kg)   Body Composition  Body Fat %: 43.8 % Fat Mass (lbs): 185.8 lbs Muscle Mass (lbs): 227.2 lbs Total Body Water (lbs): 176 lbs Visceral Fat Rating : 31   Other Clinical Data Fasting: No Labs: No Today's Visit #: 5 Starting Date: 02/18/23     HPI  Chief Complaint: OBESITY  Sotero is here to discuss his progress with his obesity treatment plan. He is on the keeping a food journal and adhering to recommended goals of 2000 calories and 120-140 protein and states he is following his eating plan approximately 100 % of the time. He states he is exercising 120 minutes 3-4 days per week.   Interval History:  Since last office visit he has lost 7 pounds.  He went on vacation since his last visit and was surprised he lost weight.  He is averaging around 1259-2802 calories, 31-223 carbs and 80-201 grams protein.  He is drinking more water.  He has started working out more.  Biking 12 miles, swimming and resistance training. His highest weight was 450 lbs.    Pharmacotherapy for weight loss: He is not currently taking medications  for medical weight loss.     Previous pharmacotherapy for medical weight loss:  None  Bariatric surgery:  Patient has not had bariatric surgery.    Pharmacotherapy for DMT2:  He is currently taking Ozempic 0.5 mg, Actos 30mg , Amaryl 2mg   and Metformin XR 500mg  3 tablets.  Reports side effects of constipation-getting better-has increased fiber intake.   Last  A1c was 8.4 CBGs: Fasting 111-155, 2 hour PP 111-209  Episodes of hypoglycemia: no On ASA 81mg  and statin.  Last eye exam:  2024 He has tried Ozempic in the past.    No results found for: "HGBA1C" No results found for: "GLUF", "MICROALBUR", "LDLCALC", "CREATININE"   Hypertension Hypertension:  BP is elevated today.  Medication(s): not currently on meds, took something in the past Denies chest pain, palpitations and SOB. FH:  father, mother  BP Readings from Last 3 Encounters:  05/19/23 (!) 150/84  04/21/23 (!) 143/84  03/24/23 131/76   No results found for: "CREATININE"   Vit D deficiency  He is taking Vit D 50,000 IU weekly.  Denies side effects.  Denies nausea, vomiting or muscle weakness.    Lab Results  Component Value Date   VD25OH 22.5 (L) 02/18/2023    PHYSICAL EXAM:  Blood pressure (!) 150/84, pulse 65, temperature 97.6 F (36.4 C), height 6\' 8"  (2.032 m), weight (!) 424 lb (192.3 kg), SpO2 95 %. Body mass index is 46.58 kg/m.  General: He is overweight, cooperative, alert, well developed, and in no acute distress. PSYCH: Has normal mood, affect and thought process.   Extremities: No edema.  Neurologic: No gross sensory or motor deficits. No tremors or fasciculations noted.    DIAGNOSTIC DATA REVIEWED:  BMET No results found for: "NA", "K", "CL", "CO2", "GLUCOSE", "BUN", "CREATININE", "CALCIUM", "GFRNONAA", "GFRAA" No results found for: "HGBA1C" Lab  Results  Component Value Date   INSULIN 9.4 02/18/2023   No results found for: "TSH" CBC No results found for: "WBC", "RBC", "HGB", "HCT", "PLT", "MCV", "MCH", "MCHC", "RDW" Iron Studies No results found for: "IRON", "TIBC", "FERRITIN", "IRONPCTSAT" Lipid Panel  No results found for: "CHOL", "TRIG", "HDL", "CHOLHDL", "VLDL", "LDLCALC", "LDLDIRECT" Hepatic Function Panel  No results found for: "PROT", "ALBUMIN", "AST", "ALT", "ALKPHOS", "BILITOT", "BILIDIR", "IBILI" No results found for:  "TSH" Nutritional Lab Results  Component Value Date   VD25OH 22.5 (L) 02/18/2023     ASSESSMENT AND PLAN  TREATMENT PLAN FOR OBESITY:  Recommended Dietary Goals  Kartier is currently in the action stage of change. As such, his goal is to continue weight management plan. He has agreed to keeping a food journal and adhering to recommended goals of 2000 calories and 120-140 protein.  Behavioral Intervention  We discussed the following Behavioral Modification Strategies today: increasing lean protein intake, decreasing simple carbohydrates , increasing vegetables, increasing lower glycemic fruits, increasing water intake, work on tracking and journaling calories using tracking application, reading food labels , keeping healthy foods at home, avoiding temptations and identifying enticing environmental cues, continue to practice mindfulness when eating, and planning for success.  Additional resources provided today: NA  Recommended Physical Activity Goals  Rudolfo has been advised to work up to 150 minutes of moderate intensity aerobic activity a week and strengthening exercises 2-3 times per week for cardiovascular health, weight loss maintenance and preservation of muscle mass.   He has agreed to Continue current level of physical activity    ASSOCIATED CONDITIONS ADDRESSED TODAY  Action/Plan  Type 2 diabetes mellitus without complication, without long-term current use of insulin (HCC) -     Increase Semaglutide (1 MG/DOSE); Inject 1 mg as directed once a week.  Dispense: 3 mL; Refill: 0.  Side effects discussed.  Will monitor for hypoglycemia.    Hypertension associated with diabetes (HCC) Discussed adding an ACE or ARB today.  Patient is asking to hold off and will re discuss at next visit.    Vitamin D deficiency -     Vitamin D (Ergocalciferol); Take 1 capsule (50,000 Units total) by mouth every 7 (seven) days.  Dispense: 5 capsule; Refill: 0  Morbid obesity (HCC)  BMI  45.0-49.9, adult (HCC)         Return in about 4 weeks (around 06/16/2023).Marland Kitchen He was informed of the importance of frequent follow up visits to maximize his success with intensive lifestyle modifications for his multiple health conditions.   ATTESTASTION STATEMENTS:  Reviewed by clinician on day of visit: allergies, medications, problem list, medical history, surgical history, family history, social history, and previous encounter notes.     Theodis Sato. Jhaden Pizzuto FNP-C

## 2023-06-10 DIAGNOSIS — E119 Type 2 diabetes mellitus without complications: Secondary | ICD-10-CM | POA: Diagnosis not present

## 2023-06-21 DIAGNOSIS — Z79899 Other long term (current) drug therapy: Secondary | ICD-10-CM | POA: Diagnosis not present

## 2023-06-21 DIAGNOSIS — Z6841 Body Mass Index (BMI) 40.0 and over, adult: Secondary | ICD-10-CM | POA: Diagnosis not present

## 2023-06-21 DIAGNOSIS — E1169 Type 2 diabetes mellitus with other specified complication: Secondary | ICD-10-CM | POA: Diagnosis not present

## 2023-06-21 DIAGNOSIS — I152 Hypertension secondary to endocrine disorders: Secondary | ICD-10-CM | POA: Diagnosis not present

## 2023-06-21 DIAGNOSIS — Z1331 Encounter for screening for depression: Secondary | ICD-10-CM | POA: Diagnosis not present

## 2023-06-22 ENCOUNTER — Other Ambulatory Visit: Payer: Self-pay | Admitting: Nurse Practitioner

## 2023-06-22 ENCOUNTER — Encounter: Payer: Self-pay | Admitting: Nurse Practitioner

## 2023-06-22 ENCOUNTER — Ambulatory Visit: Payer: BC Managed Care – PPO | Admitting: Nurse Practitioner

## 2023-06-22 VITALS — BP 122/75 | HR 73 | Temp 97.7°F | Ht >= 80 in | Wt >= 6400 oz

## 2023-06-22 DIAGNOSIS — I152 Hypertension secondary to endocrine disorders: Secondary | ICD-10-CM

## 2023-06-22 DIAGNOSIS — E1159 Type 2 diabetes mellitus with other circulatory complications: Secondary | ICD-10-CM | POA: Diagnosis not present

## 2023-06-22 DIAGNOSIS — Z6841 Body Mass Index (BMI) 40.0 and over, adult: Secondary | ICD-10-CM | POA: Diagnosis not present

## 2023-06-22 DIAGNOSIS — E119 Type 2 diabetes mellitus without complications: Secondary | ICD-10-CM

## 2023-06-22 DIAGNOSIS — Z7984 Long term (current) use of oral hypoglycemic drugs: Secondary | ICD-10-CM

## 2023-06-22 DIAGNOSIS — Z7985 Long-term (current) use of injectable non-insulin antidiabetic drugs: Secondary | ICD-10-CM

## 2023-06-22 NOTE — Progress Notes (Signed)
Office: [phone]  /  Fax: [phone]  WEIGHT SUMMARY AND BIOMETRICS  Weight Lost Since Last Visit: 0lb  Weight Gained Since Last Visit: 0lb   Vitals Temp: 97.7 F (36.5 C) BP: 122/75 Pulse Rate: 73 SpO2: 95 %   Anthropometric Measurements Height: 6\' 8"  (2.032 m) Weight: (!) 424 lb (192.3 kg) BMI (Calculated): 46.58 Weight at Last Visit: 424lb Weight Lost Since Last Visit: 0lb Weight Gained Since Last Visit: 0lb Starting Weight: 443lb Total Weight Loss (lbs): 19 lb (8.618 kg)   Body Composition  Body Fat %: 44 % Fat Mass (lbs): 186.8 lbs Muscle Mass (lbs): 226.2 lbs Total Body Water (lbs): 175.6 lbs Visceral Fat Rating : 31   Other Clinical Data Fasting: Yes Labs: No Today's Visit #: 6 Starting Date: 02/18/23     HPI  Chief Complaint: OBESITY  Steve Rojas is here to discuss his progress with his obesity treatment plan. He is on the keeping a food journal and adhering to recommended goals of 2000 calories and 120-140 protein and states he is following his eating plan approximately 90-95 % of the time. He states he is exercising 60 minutes 2-3 days per week.   Interval History:  Since last office visit he has maintained his weight.  He has been sick, has been helping to care for his grandchildren, hasn't been able to meal prep and hurt his back since his last visit.  He's leaving for [facility] tomorrow.  He notes he has gotten off track since his last visit and feels he's going to struggle on vacation.  Plans to try to avoid carbs.  He plans to get back on track once he gets home from vacation. He has increased his water over the past couple of days.    His highest weight was 450 lbs.   Bariatric surgery:  Patient has not had bariatric surgery  Pharmacotherapy for DMT2:  He is currently taking Ozempic 1mg , Actos 30mg , Amaryl 2mg   and Metformin XR 500mg  3 tablets.  Reports side effects of constipation.  Saw his PCP yesterday  Last A1c was 6.2 (yesterday) CBGs:  Fasting 93-180, 2 hour PP 79-143   Episodes of hypoglycemia: no On ASA 81mg  and statin.  Last eye exam:  2024 He has tried Ozempic in the past.    No results found for: "HGBA1C" No results found for: "GLUF", "MICROALBUR", "LDLCALC", "CREATININE"   Hypertension BP looks better today.  Medication(s): not currently on meds, took meds in the past.  Denies chest pain, palpitations and SOB.  BP Readings from Last 3 Encounters:  06/22/23 122/75  05/19/23 (!) 141/80  04/21/23 (!) 143/84   No results found for: "CREATININE"  PHYSICAL EXAM:  Blood pressure 122/75, pulse 73, temperature 97.7 F (36.5 C), height 6\' 8"  (2.032 m), weight (!) 424 lb (192.3 kg), SpO2 95%. Body mass index is 46.58 kg/m.  General: He is overweight, cooperative, alert, well developed, and in no acute distress. PSYCH: Has normal mood, affect and thought process.   Extremities: No edema.  Neurologic: No gross sensory or motor deficits. No tremors or fasciculations noted.    DIAGNOSTIC DATA REVIEWED:  BMET No results found for: "NA", "K", "CL", "CO2", "GLUCOSE", "BUN", "CREATININE", "CALCIUM", "GFRNONAA", "GFRAA" No results found for: "HGBA1C" Lab Results  Component Value Date   INSULIN 9.4 02/18/2023   No results found for: "TSH" CBC No results found for: "WBC", "RBC", "HGB", "HCT", "PLT", "MCV", "MCH", "MCHC", "RDW" Iron Studies No results found for: "IRON", "TIBC", "FERRITIN", "IRONPCTSAT"  Lipid Panel  No results found for: "CHOL", "TRIG", "HDL", "CHOLHDL", "VLDL", "LDLCALC", "LDLDIRECT" Hepatic Function Panel  No results found for: "PROT", "ALBUMIN", "AST", "ALT", "ALKPHOS", "BILITOT", "BILIDIR", "IBILI" No results found for: "TSH" Nutritional Lab Results  Component Value Date   VD25OH 22.5 (L) 02/18/2023     ASSESSMENT AND PLAN  TREATMENT PLAN FOR OBESITY:  Recommended Dietary Goals  [name] is currently in the action stage of change. As such, his goal is to continue weight management  plan. He has agreed to keeping a food journal and adhering to recommended goals of 2000 calories and 120-140 protein.  Behavioral Intervention  We discussed the following Behavioral Modification Strategies today: increasing lean protein intake, decreasing simple carbohydrates , increasing vegetables, increasing lower glycemic fruits, increasing water intake, work on meal planning and preparation, work on tracking and journaling calories using tracking application, reading food labels , keeping healthy foods at home, identifying sources and decreasing liquid calories, emotional eating strategies and understanding the difference between hunger signals and cravings, work on managing stress, creating time for self-care and relaxation measures, continue to practice mindfulness when eating, and planning for success.  Additional resources provided today: NA  Recommended Physical Activity Goals  Cilas has been advised to work up to 150 minutes of moderate intensity aerobic activity a week and strengthening exercises 2-3 times per week for cardiovascular health, weight loss maintenance and preservation of muscle mass.   He has agreed to Continue current level of physical activity  and Think about ways to increase daily physical activity and overcoming barriers to exercise  .  ASSOCIATED CONDITIONS ADDRESSED TODAY  Action/Plan  Type 2 diabetes mellitus without complication, without long-term current use of insulin (HCC) Good blood sugar control is important to decrease the likelihood of diabetic complications such as nephropathy, neuropathy, limb loss, blindness, coronary artery disease, and death. Intensive lifestyle modification including diet, exercise and weight loss are the first line of treatment for diabetes.    Will discuss increasing Ozempic to 2mg  at his next visit.   Hypertension associated with diabetes (HCC) Will continue to monitor.  Discussed again ACE/ARB today-benefits, etc. Will  consider and discuss again at next visit.    Morbid obesity (HCC)  BMI 45.0-49.9, adult (HCC)      Will obtain labs at next visit.    Return in about 3 weeks (around 07/13/2023).Marland Kitchen He was informed of the importance of frequent follow up visits to maximize his success with intensive lifestyle modifications for his multiple health conditions.   ATTESTASTION STATEMENTS:  Reviewed by clinician on day of visit: allergies, medications, problem list, medical history, surgical history, family history, social history, and previous encounter notes.   Time spent on visit including pre-visit chart review and post-visit care and charting was 30 minutes.    Theodis Sato. Steel Kerney FNP-C

## 2023-07-11 DIAGNOSIS — E119 Type 2 diabetes mellitus without complications: Secondary | ICD-10-CM | POA: Diagnosis not present

## 2023-07-22 ENCOUNTER — Ambulatory Visit: Payer: BC Managed Care – PPO | Admitting: Nurse Practitioner

## 2023-07-22 ENCOUNTER — Encounter: Payer: Self-pay | Admitting: Nurse Practitioner

## 2023-07-22 VITALS — BP 135/88 | HR 78 | Temp 98.0°F | Ht >= 80 in | Wt >= 6400 oz

## 2023-07-22 DIAGNOSIS — E785 Hyperlipidemia, unspecified: Secondary | ICD-10-CM

## 2023-07-22 DIAGNOSIS — E1169 Type 2 diabetes mellitus with other specified complication: Secondary | ICD-10-CM | POA: Diagnosis not present

## 2023-07-22 DIAGNOSIS — R5383 Other fatigue: Secondary | ICD-10-CM

## 2023-07-22 DIAGNOSIS — Z79899 Other long term (current) drug therapy: Secondary | ICD-10-CM | POA: Diagnosis not present

## 2023-07-22 DIAGNOSIS — Z7985 Long-term (current) use of injectable non-insulin antidiabetic drugs: Secondary | ICD-10-CM

## 2023-07-22 DIAGNOSIS — E119 Type 2 diabetes mellitus without complications: Secondary | ICD-10-CM

## 2023-07-22 DIAGNOSIS — E559 Vitamin D deficiency, unspecified: Secondary | ICD-10-CM | POA: Diagnosis not present

## 2023-07-22 DIAGNOSIS — Z7984 Long term (current) use of oral hypoglycemic drugs: Secondary | ICD-10-CM

## 2023-07-22 DIAGNOSIS — Z6841 Body Mass Index (BMI) 40.0 and over, adult: Secondary | ICD-10-CM

## 2023-07-22 NOTE — Progress Notes (Signed)
Office: [phone]  /  Fax: [phone]  WEIGHT SUMMARY AND BIOMETRICS  Weight Lost Since Last Visit: 7lb  Weight Gained Since Last Visit: 0lb   Vitals Temp: 98 F (36.7 C) BP: 135/88 Pulse Rate: 78 SpO2: 94 %   Anthropometric Measurements Height: 6\' 8"  (2.032 m) Weight: (!) 417 lb (189.1 kg) BMI (Calculated): 45.81 Weight at Last Visit: 424lb Weight Lost Since Last Visit: 7lb Weight Gained Since Last Visit: 0lb Starting Weight: 443lb Total Weight Loss (lbs): 26 lb (11.8 kg)   Body Composition  Body Fat %: 43.2 % Fat Mass (lbs): 180.4 lbs Muscle Mass (lbs): 226 lbs Total Body Water (lbs): 171.6 lbs Visceral Fat Rating : 30   Other Clinical Data Fasting: Yes Labs: No Today's Visit #: 7 Starting Date: 02/18/23     HPI  Chief Complaint: OBESITY  Steve Rojas is here to discuss his progress with his obesity treatment plan. He is on the keeping a food journal and adhering to recommended goals of 2000 calories and 120-140 protein and states he is following his eating plan approximately 50 % of the time. He states he is exercising 0 minutes 0 days per week.   Interval History:  Since last office visit he has lost 7 pounds. He has been on vacation since his last visit. He has gotten off track.  He is not tracking and hasn't been exercising.  He is going to [place] in October.     His highest weight was 450 lbs.    Pharmacotherapy for weight loss: He is not currently taking medications  for medical weight loss.    Previous pharmacotherapy for medical weight loss:  none  Bariatric surgery:  Patient has not had bariatric surgery  Pharmacotherapy for DMT2:  He is currently taking Ozempic 1mg , Actos 30mg , Amaryl 2mg  and Metformin XR 500mg  3 tablets. Reports side effects of diarrhea.  Has been taking OTC meds with little relief.    Last A1c was 6.2 CBGs: Fasting 120-140, 2 hour PP 98-130s,  Episodes of hypoglycemia: no On ASA 81mg  and statin.  Last eye exam:   2024 He has tried Ozempic in the past.    No results found for: "HGBA1C" No results found for: "GLUF", "MICROALBUR", "LDLCALC", "CREATININE"   Hyperlipidemia Medication(s): lipitor 20mg . Denies side effects.    No results found for: "CHOL", "HDL", "LDLCALC", "LDLDIRECT", "TRIG", "CHOLHDL" No results found for: "ALT", "AST", "GGT", "ALKPHOS", "BILITOT" The ASCVD Risk score (Arnett DK, et al., 2019) failed to calculate for the following reasons:   Cannot find a previous HDL lab   Cannot find a previous total cholesterol lab   Vit D deficiency  He is taking Vit D 50,000 IU weekly.  Denies side effects.  Denies nausea, vomiting or muscle weakness.  Reports fatigue.   Lab Results  Component Value Date   VD25OH 22.5 (L) 02/18/2023    PHYSICAL EXAM:  Blood pressure 135/88, pulse 78, temperature 98 F (36.7 C), height 6\' 8"  (2.032 m), weight (!) 417 lb (189.1 kg), SpO2 94%. Body mass index is 45.81 kg/m.  General: He is overweight, cooperative, alert, well developed, and in no acute distress. PSYCH: Has normal mood, affect and thought process.   Extremities: No edema.  Neurologic: No gross sensory or motor deficits. No tremors or fasciculations noted.    DIAGNOSTIC DATA REVIEWED:  BMET No results found for: "NA", "K", "CL", "CO2", "GLUCOSE", "BUN", "CREATININE", "CALCIUM", "GFRNONAA", "GFRAA" No results found for: "HGBA1C" Lab Results  Component Value Date  INSULIN 9.4 02/18/2023   No results found for: "TSH" CBC No results found for: "WBC", "RBC", "HGB", "HCT", "PLT", "MCV", "MCH", "MCHC", "RDW" Iron Studies No results found for: "IRON", "TIBC", "FERRITIN", "IRONPCTSAT" Lipid Panel  No results found for: "CHOL", "TRIG", "HDL", "CHOLHDL", "VLDL", "LDLCALC", "LDLDIRECT" Hepatic Function Panel  No results found for: "PROT", "ALBUMIN", "AST", "ALT", "ALKPHOS", "BILITOT", "BILIDIR", "IBILI" No results found for: "TSH" Nutritional Lab Results  Component Value Date   VD25OH  22.5 (L) 02/18/2023     ASSESSMENT AND PLAN  TREATMENT PLAN FOR OBESITY:  Recommended Dietary Goals  Tobechukwu is currently in the action stage of change. As such, his goal is to continue weight management plan. He has agreed to to restart tracking and will review at next visit.  Behavioral Intervention  We discussed the following Behavioral Modification Strategies today: increasing lean protein intake, decreasing simple carbohydrates , increasing vegetables, increasing lower glycemic fruits, increasing water intake, work on tracking and journaling calories using tracking application, continue to practice mindfulness when eating, and planning for success.  Additional resources provided today: NA  Recommended Physical Activity Goals  Hiep has been advised to work up to 150 minutes of moderate intensity aerobic activity a week and strengthening exercises 2-3 times per week for cardiovascular health, weight loss maintenance and preservation of muscle mass.   He has agreed to Think about ways to increase daily physical activity and overcoming barriers to exercise and Increase physical activity in their day and reduce sedentary time (increase NEAT).    ASSOCIATED CONDITIONS ADDRESSED TODAY  Action/Plan  Type 2 diabetes mellitus without complication, without long-term current use of insulin (HCC) -     Comprehensive metabolic panel -     CBC with Differential/Platelet  Decrease Metformin to 2 daily Decrease ozempic 0.5mg  To send me a message via mychart next week and weekly until next visit-BS and update  Hyperlipidemia associated with type 2 diabetes mellitus (HCC) -     Comprehensive metabolic panel -     CBC with Differential/Platelet -     Lipid Panel With LDL/HDL Ratio  Cardiovascular risk and specific lipid/LDL goals reviewed.  We discussed several lifestyle modifications today and Hobart will continue to work on diet, exercise and weight loss efforts. Orders and follow up as  documented in patient record.   Counseling Intensive lifestyle modifications are the first line treatment for this issue. Dietary changes: Increase soluble fiber. Decrease simple carbohydrates. Exercise changes: Moderate to vigorous-intensity aerobic activity 150 minutes per week if tolerated. Lipid-lowering medications: see documented in medical record.   Vitamin D deficiency -     Comprehensive metabolic panel -     VITAMIN D 25 Hydroxy (Vit-D Deficiency, Fractures)  Low Vitamin D level contributes to fatigue and are associated with obesity, breast, and colon cancer. He agrees to continue to take prescription Vitamin D @50 ,000 IU every week and will follow-up for routine testing of Vitamin D, at least 2-3 times per year to avoid over-replacement.   Other fatigue Labs obtained today  Medication management -     Comprehensive metabolic panel -     CBC with Differential/Platelet -     Vitamin B12  Morbid obesity (HCC) -     CBC with Differential/Platelet -     TSH  BMI 45.0-49.9, adult (HCC) -     CBC with Differential/Platelet -     TSH         Return in about 4 weeks (around 08/19/2023).Marland Kitchen He was informed of  the importance of frequent follow up visits to maximize his success with intensive lifestyle modifications for his multiple health conditions.   ATTESTASTION STATEMENTS:  Reviewed by clinician on day of visit: allergies, medications, problem list, medical history, surgical history, family history, social history, and previous encounter notes.      Theodis Sato. Ramani Riva FNP-C

## 2023-07-23 LAB — CBC WITH DIFFERENTIAL/PLATELET
Basophils Absolute: 0.1 10*3/uL (ref 0.0–0.2)
Basos: 1 %
EOS (ABSOLUTE): 0.1 10*3/uL (ref 0.0–0.4)
Eos: 2 %
Hematocrit: 45.3 % (ref 37.5–51.0)
Hemoglobin: 15.4 g/dL (ref 13.0–17.7)
Immature Grans (Abs): 0 10*3/uL (ref 0.0–0.1)
Immature Granulocytes: 1 %
Lymphocytes Absolute: 1.8 10*3/uL (ref 0.7–3.1)
Lymphs: 30 %
MCH: 29.1 pg (ref 26.6–33.0)
MCHC: 34 g/dL (ref 31.5–35.7)
MCV: 86 fL (ref 79–97)
Monocytes Absolute: 0.4 10*3/uL (ref 0.1–0.9)
Monocytes: 7 %
Neutrophils Absolute: 3.6 10*3/uL (ref 1.4–7.0)
Neutrophils: 59 %
Platelets: 287 10*3/uL (ref 150–450)
RBC: 5.29 x10E6/uL (ref 4.14–5.80)
RDW: 13.3 % (ref 11.6–15.4)
WBC: 6 10*3/uL (ref 3.4–10.8)

## 2023-07-23 LAB — LIPID PANEL WITH LDL/HDL RATIO
Cholesterol, Total: 142 mg/dL (ref 100–199)
HDL: 45 mg/dL (ref >39)
LDL Chol Calc (NIH): 81 mg/dL (ref 0–99)
LDL/HDL Ratio: 1.8 ratio (ref 0.0–3.6)
Triglycerides: 82 mg/dL (ref 0–149)
VLDL Cholesterol Cal: 16 mg/dL (ref 5–40)

## 2023-07-23 LAB — COMPREHENSIVE METABOLIC PANEL
ALT: 21 IU/L (ref 0–44)
AST: 13 IU/L (ref 0–40)
Albumin: 4.3 g/dL (ref 3.8–4.9)
Alkaline Phosphatase: 102 IU/L (ref 44–121)
BUN/Creatinine Ratio: 16 (ref 10–24)
BUN: 14 mg/dL (ref 8–27)
Bilirubin Total: 0.9 mg/dL (ref 0.0–1.2)
CO2: 22 mmol/L (ref 20–29)
Calcium: 9.8 mg/dL (ref 8.6–10.2)
Chloride: 100 mmol/L (ref 96–106)
Creatinine, Ser: 0.89 mg/dL (ref 0.76–1.27)
Globulin, Total: 2.6 g/dL (ref 1.5–4.5)
Glucose: 114 mg/dL — ABNORMAL HIGH (ref 70–99)
Potassium: 4.4 mmol/L (ref 3.5–5.2)
Sodium: 138 mmol/L (ref 134–144)
Total Protein: 6.9 g/dL (ref 6.0–8.5)
eGFR: 98 mL/min/{1.73_m2} (ref >59)

## 2023-07-23 LAB — VITAMIN D 25 HYDROXY (VIT D DEFICIENCY, FRACTURES): Vit D, 25-Hydroxy: 43.2 ng/mL (ref 30.0–100.0)

## 2023-07-23 LAB — TSH: TSH: 3.2 u[IU]/mL (ref 0.450–4.500)

## 2023-07-23 LAB — VITAMIN B12: Vitamin B-12: 434 pg/mL (ref 232–1245)

## 2023-07-29 ENCOUNTER — Encounter: Payer: Self-pay | Admitting: Nurse Practitioner

## 2023-08-05 ENCOUNTER — Other Ambulatory Visit: Payer: Self-pay | Admitting: Nurse Practitioner

## 2023-08-05 MED ORDER — METFORMIN HCL ER 500 MG PO TB24: 1500.0000 mg | ORAL_TABLET | Freq: Every day | ORAL | 0 refills | Status: DC

## 2023-08-10 DIAGNOSIS — E119 Type 2 diabetes mellitus without complications: Secondary | ICD-10-CM | POA: Diagnosis not present

## 2023-08-26 ENCOUNTER — Telehealth: Payer: BC Managed Care – PPO | Admitting: Nurse Practitioner

## 2023-08-26 ENCOUNTER — Encounter: Payer: Self-pay | Admitting: Nurse Practitioner

## 2023-08-26 DIAGNOSIS — E119 Type 2 diabetes mellitus without complications: Secondary | ICD-10-CM | POA: Diagnosis not present

## 2023-08-26 DIAGNOSIS — Z7984 Long term (current) use of oral hypoglycemic drugs: Secondary | ICD-10-CM | POA: Diagnosis not present

## 2023-08-26 DIAGNOSIS — Z6841 Body Mass Index (BMI) 40.0 and over, adult: Secondary | ICD-10-CM | POA: Diagnosis not present

## 2023-08-26 DIAGNOSIS — Z7985 Long-term (current) use of injectable non-insulin antidiabetic drugs: Secondary | ICD-10-CM

## 2023-08-26 NOTE — Progress Notes (Signed)
Office: [phone]  /  Fax: [phone]  WEIGHT SUMMARY AND BIOMETRICS  TeleHealth Visit:  This visit was completed with telemedicine (audio/video) technology. Steve Rojas has verbally consented to this TeleHealth visit. The patient is located at home, the provider is located at [facility]. The participants in this visit include the listed provider and patient. The visit was conducted today via MyChart video.    HPI  Chief Complaint: OBESITY  Steve Rojas is on video visit today to discuss his progress with his obesity treatment plan. He is on the practicing portion control and making smarter food choices, such as increasing vegetables and decreasing simple carbohydrates and states he is following his eating plan approximately 50 % of the time. He has not been tracking but plans to start.  He states he is exercising 60 minutes 3 days per week-walking and resistance training.    Interval History:  He doesn't know his current weight.  He doesn't have a scale at home. He feels that he has gotten off track for the past 1.5 months.  He is not skipping meals. He has 1-2 snacks per day.  Tends to get off track on the weekends.  He is drinking water and sometimes diet coke or tea.   He's going to [facility] 10/23-10/29  Pharmacotherapy for weight loss: He is not currently taking medications  for medical weight loss.     Previous pharmacotherapy for medical weight loss:  none   Bariatric surgery:  Patient has not had bariatric surgery  Pharmacotherapy for DMT2:  He is currently taking  Ozempic 1mg , Actos 30mg , Amaryl 2mg  and Metformin XR 500mg  3 tablets (increased back to 3 tablets over the past two days).  Since increasing to 3 tablets he's having side effects of diarrhea.  When taking 1 or 2 tablets, he didn't have side effects of diarrhea.  He notes until last week, his diet consisted of more carbs and he was off track.  He has decreased carbs over the past week and plans to  start tracking.   Last A1c was 6.2 CBGs: Oct 10th- 134 -119 -X Oct 11th - 142 -X - X Oct 12th - 134 -108- X Oct13th - 141 - X- 137 Oct 14th - 161 -144 -151 Oct 15th - 144 -146 -139 Oct 16th - 127 - 112 - X Oct 17th - 113 - He has been sending me weekly blood sugars via mychart.  Episodes of hypoglycemia: no On ASA and statin Last eye exam:  2024 He has tried Ozempic in the past.    No results found for: "HGBA1C" Lab Results  Component Value Date   LDLCALC 81 07/22/2023   CREATININE 0.89 07/22/2023     PHYSICAL EXAM:  There were no vitals taken for this visit. There is no height or weight on file to calculate BMI.  General: He is overweight, cooperative, alert, well developed, and in no acute distress. PSYCH: Has normal mood, affect and thought process.   Extremities: No edema.  Neurologic: No gross sensory or motor deficits. No tremors or fasciculations noted.    DIAGNOSTIC DATA REVIEWED:  BMET    Component Value Date/Time   NA 138 07/22/2023 0933   K 4.4 07/22/2023 0933   CL 100 07/22/2023 0933   CO2 22 07/22/2023 0933   GLUCOSE 114 (H) 07/22/2023 0933   BUN 14 07/22/2023 0933   CREATININE 0.89 07/22/2023 0933   CALCIUM 9.8 07/22/2023 0933   No results found for: "HGBA1C" Lab  Results  Component Value Date   INSULIN 9.4 02/18/2023   Lab Results  Component Value Date   TSH 3.200 07/22/2023   CBC    Component Value Date/Time   WBC 6.0 07/22/2023 0933   RBC 5.29 07/22/2023 0933   HGB 15.4 07/22/2023 0933   HCT 45.3 07/22/2023 0933   PLT 287 07/22/2023 0933   MCV 86 07/22/2023 0933   MCH 29.1 07/22/2023 0933   MCHC 34.0 07/22/2023 0933   RDW 13.3 07/22/2023 0933   Iron Studies No results found for: "IRON", "TIBC", "FERRITIN", "IRONPCTSAT" Lipid Panel     Component Value Date/Time   CHOL 142 07/22/2023 0933   TRIG 82 07/22/2023 0933   HDL 45 07/22/2023 0933   LDLCALC 81 07/22/2023 0933   Hepatic Function Panel     Component Value Date/Time    PROT 6.9 07/22/2023 0933   ALBUMIN 4.3 07/22/2023 0933   AST 13 07/22/2023 0933   ALT 21 07/22/2023 0933   ALKPHOS 102 07/22/2023 0933   BILITOT 0.9 07/22/2023 0933      Component Value Date/Time   TSH 3.200 07/22/2023 0933   Nutritional Lab Results  Component Value Date   VD25OH 43.2 07/22/2023   VD25OH 22.5 (L) 02/18/2023     ASSESSMENT AND PLAN  TREATMENT PLAN FOR OBESITY:  Recommended Dietary Goals  Steve Rojas is currently in the action stage of change. As such, his goal is to continue weight management plan. He has agreed to keeping a food journal and adhering to recommended goals of 2000 calories and 120-140 protein.  Behavioral Intervention  We discussed the following Behavioral Modification Strategies today: increasing lean protein intake to established goals, increasing water intake , work on meal planning and preparation, work on tracking and journaling calories using tracking application, reading food labels , keeping healthy foods at home, and continue to work on maintaining a reduced calorie state, getting the recommended amount of protein, incorporating whole foods, making healthy choices, staying well hydrated and practicing mindfulness when eating..  Additional resources provided today: NA  Recommended Physical Activity Goals  Steve Rojas has been advised to work up to 150 minutes of moderate intensity aerobic activity a week and strengthening exercises 2-3 times per week for cardiovascular health, weight loss maintenance and preservation of muscle mass.   He has agreed to Think about enjoyable ways to increase daily physical activity and overcoming barriers to exercise and Increase physical activity in their day and reduce sedentary time (increase NEAT).   ASSOCIATED CONDITIONS ADDRESSED TODAY  Action/Plan  Type 2 diabetes mellitus without complication, without long-term current use of insulin (HCC) Continue Metformin 2 daily.  Has side effects with 3 daily.    Continue Ozempic 1mg .  Continue Amaryl 4 mg Continue Actos 30mg   Goal to increase Ozempic 2mg  and continue other meds as directed.  Will discuss between visits and at next visit.  To send me weekly BS via mychart.   Morbid obesity (HCC)  BMI 45.0-49.9, adult (HCC)      Labs reviewed in chart with patient from 07/22/23   Return in about 4 weeks (around 09/23/2023).Marland Kitchen He was informed of the importance of frequent follow up visits to maximize his success with intensive lifestyle modifications for his multiple health conditions.   ATTESTASTION STATEMENTS:  Reviewed by clinician on day of visit: allergies, medications, problem list, medical history, surgical history, family history, social history, and previous encounter notes.   Time spent on visit including pre-visit chart review and post-visit care and  charting was 30 minutes.    Theodis Sato. Sapphira Harjo FNP-C

## 2023-08-27 ENCOUNTER — Other Ambulatory Visit: Payer: Self-pay | Admitting: Nurse Practitioner

## 2023-09-10 DIAGNOSIS — E119 Type 2 diabetes mellitus without complications: Secondary | ICD-10-CM | POA: Diagnosis not present

## 2023-09-15 ENCOUNTER — Ambulatory Visit: Payer: BC Managed Care – PPO | Admitting: Nurse Practitioner

## 2023-09-16 ENCOUNTER — Ambulatory Visit (INDEPENDENT_AMBULATORY_CARE_PROVIDER_SITE_OTHER): Payer: BC Managed Care – PPO | Admitting: Nurse Practitioner

## 2023-09-16 ENCOUNTER — Encounter: Payer: Self-pay | Admitting: Nurse Practitioner

## 2023-09-16 VITALS — BP 138/79 | HR 75 | Temp 98.0°F | Ht >= 80 in | Wt >= 6400 oz

## 2023-09-16 DIAGNOSIS — E119 Type 2 diabetes mellitus without complications: Secondary | ICD-10-CM

## 2023-09-16 DIAGNOSIS — Z7984 Long term (current) use of oral hypoglycemic drugs: Secondary | ICD-10-CM

## 2023-09-16 DIAGNOSIS — Z6841 Body Mass Index (BMI) 40.0 and over, adult: Secondary | ICD-10-CM

## 2023-09-16 DIAGNOSIS — Z7985 Long-term (current) use of injectable non-insulin antidiabetic drugs: Secondary | ICD-10-CM

## 2023-09-16 NOTE — Progress Notes (Signed)
Office: [phone]  /  Fax: [phone]  WEIGHT SUMMARY AND BIOMETRICS  Weight Lost Since Last Visit: 0lb  Weight Gained Since Last Visit: 14lb   Vitals Temp: 98 F (36.7 C) BP: 138/79 Pulse Rate: 75 SpO2: 96 %   Anthropometric Measurements Height: 6\' 8"  (2.032 m) Weight: (!) 431 lb (195.5 kg) BMI (Calculated): 47.35 Weight at Last Visit: 417lb Weight Lost Since Last Visit: 0lb Weight Gained Since Last Visit: 14lb Starting Weight: 443lb Total Weight Loss (lbs): 12 lb (5.443 kg)   Body Composition  Body Fat %: 44.8 % Fat Mass (lbs): 193.2 lbs Muscle Mass (lbs): 226.8 lbs Total Body Water (lbs): 187.2 lbs Visceral Fat Rating : 32   Other Clinical Data Fasting: No Labs: No Today's Visit #: 8 Starting Date: 02/18/23     HPI  Chief Complaint: OBESITY  Bentlee is here to discuss his progress with his obesity treatment plan. He is on the keeping a food journal and adhering to recommended goals of 2000 calories and 120-140 protein and states he is following his eating plan approximately 80-90 % of the time. He states he is exercising 60 minutes 2-3 days per week.   Interval History:  Since last office visit he has gained 14 pounds. He feels that he has gotten off track and hasn't been able to focus on himself due to traveling.  He wasn't able to follow the plan or exercise as he would like. He was eating out more.  He feels that he started trying to get back on track Nov 1st-started tracking and monitoring his BS' again.  He has been struggling with should pain since August.  He stopped swimming and now feels that his shoulder is doing better.  He plans to start going back to the gym.    No upcoming vacations.     Pharmacotherapy for weight loss: He is not currently taking medications  for medical weight loss.    Previous pharmacotherapy for medical weight loss:  none  Bariatric surgery:  Patient has not had bariatric surgery.   Pharmacotherapy for DMT2:   He is currently taking Ozempic 1mg , Actos 30mg , Amaryl 2mg  and Metformin XR 500mg  2 tablets .  Reports side effects of constipation and diarrhea.  Has gotten better since decreasing Metformin. Tends to struggle more with side effects when he takes 3 Metformin.   Last A1c was 6.2  CBGs:  Nov 1st  A1C > 128 - 114 - 142     Cal >2100 Carbs > 84 Protein >150 Nov 2nd A1C >128 - X -110           Cal> 1824 Carbs > 75 Protein >114 Nov 3rd  A1C >135 -115 - 120      Cal >1555 Carbs > 65 Protein >125   Nov 4th A1C > 136 - X - X               Cal > 1927 Carbs > 59  Protein >115 Nov 5th A1C > 115 - X- 145            Cal > 1765 Carbs > 51 Protein >131   Episodes of hypoglycemia: no On ASA 81mg  and statin.  Last eye exam:  2024 He has tried Ozempic in the past.    No results found for: "HGBA1C" Lab Results  Component Value Date   LDLCALC 81 07/22/2023   CREATININE 0.89 07/22/2023        PHYSICAL EXAM:  Blood pressure 138/79,  pulse 75, temperature 98 F (36.7 C), height 6\' 8"  (2.032 m), weight (!) 431 lb (195.5 kg), SpO2 96%. Body mass index is 47.35 kg/m.  General: He is overweight, cooperative, alert, well developed, and in no acute distress. PSYCH: Has normal mood, affect and thought process.   Extremities: No edema.  Neurologic: No gross sensory or motor deficits. No tremors or fasciculations noted.    DIAGNOSTIC DATA REVIEWED:  BMET    Component Value Date/Time   NA 138 07/22/2023 0933   K 4.4 07/22/2023 0933   CL 100 07/22/2023 0933   CO2 22 07/22/2023 0933   GLUCOSE 114 (H) 07/22/2023 0933   BUN 14 07/22/2023 0933   CREATININE 0.89 07/22/2023 0933   CALCIUM 9.8 07/22/2023 0933   No results found for: "HGBA1C" Lab Results  Component Value Date   INSULIN 9.4 02/18/2023   Lab Results  Component Value Date   TSH 3.200 07/22/2023   CBC    Component Value Date/Time   WBC 6.0 07/22/2023 0933   RBC 5.29 07/22/2023 0933   HGB 15.4 07/22/2023 0933   HCT 45.3 07/22/2023  0933   PLT 287 07/22/2023 0933   MCV 86 07/22/2023 0933   MCH 29.1 07/22/2023 0933   MCHC 34.0 07/22/2023 0933   RDW 13.3 07/22/2023 0933   Iron Studies No results found for: "IRON", "TIBC", "FERRITIN", "IRONPCTSAT" Lipid Panel     Component Value Date/Time   CHOL 142 07/22/2023 0933   TRIG 82 07/22/2023 0933   HDL 45 07/22/2023 0933   LDLCALC 81 07/22/2023 0933   Hepatic Function Panel     Component Value Date/Time   PROT 6.9 07/22/2023 0933   ALBUMIN 4.3 07/22/2023 0933   AST 13 07/22/2023 0933   ALT 21 07/22/2023 0933   ALKPHOS 102 07/22/2023 0933   BILITOT 0.9 07/22/2023 0933      Component Value Date/Time   TSH 3.200 07/22/2023 0933   Nutritional Lab Results  Component Value Date   VD25OH 43.2 07/22/2023   VD25OH 22.5 (L) 02/18/2023     ASSESSMENT AND PLAN  TREATMENT PLAN FOR OBESITY:  Recommended Dietary Goals  Burnham is currently in the action stage of change. As such, his goal is to continue weight management plan. He has agreed to keeping a food journal and adhering to recommended goals of 2000 calories and 120-140 protein.  Behavioral Intervention  We discussed the following Behavioral Modification Strategies today: increasing lean protein intake to established goals, increasing water intake , work on tracking and journaling calories using tracking application, and continue to work on maintaining a reduced calorie state, getting the recommended amount of protein, incorporating whole foods, making healthy choices, staying well hydrated and practicing mindfulness when eating..  Additional resources provided today: NA  Recommended Physical Activity Goals  Ehsan has been advised to work up to 150 minutes of moderate intensity aerobic activity a week and strengthening exercises 2-3 times per week for cardiovascular health, weight loss maintenance and preservation of muscle mass.   He has agreed to Think about enjoyable ways to increase daily physical  activity and overcoming barriers to exercise, Increase physical activity in their day and reduce sedentary time (increase NEAT)., Increase the intensity, frequency or duration of strengthening exercises , and Increase the intensity, frequency or duration of aerobic exercises     ASSOCIATED CONDITIONS ADDRESSED TODAY  Action/Plan  Type 2 diabetes mellitus without complication, without long-term current use of insulin (HCC) To send me blood sugars weekly.  In  2 weeks will consider increasing Ozempic 2mg  and decreasing Metformin to 1 pill daily.    Morbid obesity (HCC)  BMI 45.0-49.9, adult (HCC)     Start a probiotic   Has appt with PCP on Nov 18th for follow up and labs.   He's up 16 pounds water weight. Has been eating out a lot.   Will continue to monitor.    Return in about 3 weeks (around 10/07/2023).Marland Kitchen He was informed of the importance of frequent follow up visits to maximize his success with intensive lifestyle modifications for his multiple health conditions.   ATTESTASTION STATEMENTS:  Reviewed by clinician on day of visit: allergies, medications, problem list, medical history, surgical history, family history, social history, and previous encounter notes.   Time spent on visit including pre-visit chart review and post-visit care and charting was 30 minutes.    Theodis Sato. Danielle Mink FNP-C

## 2023-09-27 DIAGNOSIS — Z79899 Other long term (current) drug therapy: Secondary | ICD-10-CM | POA: Diagnosis not present

## 2023-09-27 DIAGNOSIS — Z6841 Body Mass Index (BMI) 40.0 and over, adult: Secondary | ICD-10-CM | POA: Diagnosis not present

## 2023-09-27 DIAGNOSIS — I152 Hypertension secondary to endocrine disorders: Secondary | ICD-10-CM | POA: Diagnosis not present

## 2023-09-27 DIAGNOSIS — Z1331 Encounter for screening for depression: Secondary | ICD-10-CM | POA: Diagnosis not present

## 2023-09-27 DIAGNOSIS — E1169 Type 2 diabetes mellitus with other specified complication: Secondary | ICD-10-CM | POA: Diagnosis not present

## 2023-09-29 ENCOUNTER — Ambulatory Visit: Payer: BC Managed Care – PPO | Admitting: Nurse Practitioner

## 2023-10-10 DIAGNOSIS — E119 Type 2 diabetes mellitus without complications: Secondary | ICD-10-CM | POA: Diagnosis not present

## 2023-10-12 ENCOUNTER — Ambulatory Visit: Payer: BC Managed Care – PPO | Admitting: Nurse Practitioner

## 2023-10-12 ENCOUNTER — Encounter: Payer: Self-pay | Admitting: Nurse Practitioner

## 2023-10-12 VITALS — BP 134/77 | HR 82 | Temp 98.4°F | Ht >= 80 in | Wt >= 6400 oz

## 2023-10-12 DIAGNOSIS — Z7985 Long-term (current) use of injectable non-insulin antidiabetic drugs: Secondary | ICD-10-CM

## 2023-10-12 DIAGNOSIS — E119 Type 2 diabetes mellitus without complications: Secondary | ICD-10-CM

## 2023-10-12 DIAGNOSIS — Z7984 Long term (current) use of oral hypoglycemic drugs: Secondary | ICD-10-CM

## 2023-10-12 DIAGNOSIS — E559 Vitamin D deficiency, unspecified: Secondary | ICD-10-CM

## 2023-10-12 DIAGNOSIS — Z6841 Body Mass Index (BMI) 40.0 and over, adult: Secondary | ICD-10-CM

## 2023-10-12 MED ORDER — VITAMIN D (ERGOCALCIFEROL) 1.25 MG (50000 UNIT) PO CAPS: 50000.0000 [IU] | ORAL_CAPSULE | ORAL | 0 refills | Status: DC

## 2023-10-12 MED ORDER — OZEMPIC (1 MG/DOSE) 4 MG/3ML ~~LOC~~ SOPN: 1.0000 mg | PEN_INJECTOR | SUBCUTANEOUS | 0 refills | Status: DC

## 2023-10-12 NOTE — Progress Notes (Signed)
Office: [phone]  /  Fax: [phone]  WEIGHT SUMMARY AND BIOMETRICS  Weight Lost Since Last Visit: 11lb  Weight Gained Since Last Visit: 0lb   Vitals Temp: 98.4 F (36.9 C) BP: 134/77 Pulse Rate: 82 SpO2: 95 %   Anthropometric Measurements Height: 6\' 8"  (2.032 m) Weight: (!) 420 lb (190.5 kg) BMI (Calculated): 46.14 Weight at Last Visit: 431lb Weight Lost Since Last Visit: 11lb Weight Gained Since Last Visit: 0lb Starting Weight: 443lb Total Weight Loss (lbs): 23 lb (10.4 kg)   No data recorded Other Clinical Data Fasting: No Labs: No Today's Visit #: 9 Starting Date: 02/18/23     HPI  Chief Complaint: OBESITY  Steve Rojas is here to discuss his progress with his obesity treatment plan. He is on the keeping a food journal and adhering to recommended goals of 2000 calories and 120-140 protein and states he is following his eating plan approximately 70 % of the time. He states he is exercising 60 minutes 2-3 days per week.   Interval History:  Since last office visit he has lost 11 pounds.  He has been watching his portion sizes.  He overall hasn't been feeling well and has an appt with his PCP on 10/14/23. He has some stress at home.    Previous pharmacotherapy for medical weight loss:  none   Bariatric surgery:  Patient has not had bariatric surgery.   Pharmacotherapy for DMT2:  He is currently taking Ozempic 1mg , Actos 30mg , Amaryl 2mg  and Metformin XR 500mg  2 tablets.  Reports side effects of constipation.  Taking a probiotic daily.  He is complaining of left hip/lower abd pain-"a dull thud for the past 3-4 weeks".  Has appt with PCP on 10/14/23 for eval.   Last A1c was 6.9 on 09/27/23 Episodes of hypoglycemia: no On ASA 81mg  and statin.  Last eye exam:  2024 He has tried Ozempic in the past.  CBGs: 120-150  No results found for: "HGBA1C" Lab Results  Component Value Date   LDLCALC 81 07/22/2023   CREATININE 0.89 07/22/2023    Vit D deficiency   He is taking Vit D 50,000 IU weekly.  Denies side effects.  Denies nausea, vomiting or muscle weakness.    Lab Results  Component Value Date   VD25OH 43.2 07/22/2023   VD25OH 22.5 (L) 02/18/2023     PHYSICAL EXAM:  Blood pressure 134/77, pulse 82, temperature 98.4 F (36.9 C), height 6\' 8"  (2.032 m), weight (!) 420 lb (190.5 kg), SpO2 95%. Body mass index is 46.14 kg/m.  General: He is overweight, cooperative, alert, well developed, and in no acute distress. PSYCH: Has normal mood, affect and thought process.   Extremities: No edema.  Neurologic: No gross sensory or motor deficits. No tremors or fasciculations noted.    DIAGNOSTIC DATA REVIEWED:  BMET    Component Value Date/Time   NA 138 07/22/2023 0933   K 4.4 07/22/2023 0933   CL 100 07/22/2023 0933   CO2 22 07/22/2023 0933   GLUCOSE 114 (H) 07/22/2023 0933   BUN 14 07/22/2023 0933   CREATININE 0.89 07/22/2023 0933   CALCIUM 9.8 07/22/2023 0933   No results found for: "HGBA1C" Lab Results  Component Value Date   INSULIN 9.4 02/18/2023   Lab Results  Component Value Date   TSH 3.200 07/22/2023   CBC    Component Value Date/Time   WBC 6.0 07/22/2023 0933   RBC 5.29 07/22/2023 0933   HGB 15.4 07/22/2023 0933   HCT  45.3 07/22/2023 0933   PLT 287 07/22/2023 0933   MCV 86 07/22/2023 0933   MCH 29.1 07/22/2023 0933   MCHC 34.0 07/22/2023 0933   RDW 13.3 07/22/2023 0933   Iron Studies No results found for: "IRON", "TIBC", "FERRITIN", "IRONPCTSAT" Lipid Panel     Component Value Date/Time   CHOL 142 07/22/2023 0933   TRIG 82 07/22/2023 0933   HDL 45 07/22/2023 0933   LDLCALC 81 07/22/2023 0933   Hepatic Function Panel     Component Value Date/Time   PROT 6.9 07/22/2023 0933   ALBUMIN 4.3 07/22/2023 0933   AST 13 07/22/2023 0933   ALT 21 07/22/2023 0933   ALKPHOS 102 07/22/2023 0933   BILITOT 0.9 07/22/2023 0933      Component Value Date/Time   TSH 3.200 07/22/2023 0933   Nutritional Lab  Results  Component Value Date   VD25OH 43.2 07/22/2023   VD25OH 22.5 (L) 02/18/2023     ASSESSMENT AND PLAN  TREATMENT PLAN FOR OBESITY:  Recommended Dietary Goals  Steve Rojas is currently in the action stage of change. As such, his goal is to continue weight management plan. He has agreed to keeping a food journal and adhering to recommended goals of 2000 calories and 120-140 grams of protein.  Behavioral Intervention  We discussed the following Behavioral Modification Strategies today: increasing lean protein intake to established goals, increasing water intake , work on tracking and journaling calories using tracking application, planning for success, and continue to work on maintaining a reduced calorie state, getting the recommended amount of protein, incorporating whole foods, making healthy choices, staying well hydrated and practicing mindfulness when eating..  Additional resources provided today: NA  Recommended Physical Activity Goals  Steve Rojas has been advised to work up to 150 minutes of moderate intensity aerobic activity a week and strengthening exercises 2-3 times per week for cardiovascular health, weight loss maintenance and preservation of muscle mass.   He has agreed to Think about enjoyable ways to increase daily physical activity and overcoming barriers to exercise, Increase physical activity in their day and reduce sedentary time (increase NEAT)., Increase the intensity, frequency or duration of strengthening exercises , and Increase the intensity, frequency or duration of aerobic exercises      ASSOCIATED CONDITIONS ADDRESSED TODAY  Action/Plan  Type 2 diabetes mellitus without complication, without long-term current use of insulin (HCC) -     Ozempic (1 MG/DOSE); Inject 1 mg into the skin once a week.  Dispense: 3 mL; Refill: 0. Side effects discussed  Discussed increasing Metformin back to 3 pills and decreasing Ozempic to 0.5 mg for the next couple of weeks due  to side effects.    I'm not going to increase his Ozempic dose at this time due to current complaints.  I would like him to see his PCP for follow up and eval.  Will consider making changes to his dose at his next visit based upon how he is doing.    Vitamin D deficiency -     Vitamin D (Ergocalciferol); Take 1 capsule (50,000 Units total) by mouth every 7 (seven) days.  Dispense: 5 capsule; Refill: 0  Morbid obesity (HCC)  BMI 45.0-49.9, adult (HCC)     Keep appt with PCP on 10/14/23.  If pain worsens to go to urgent care.  Offered to call PCP to see is can get a sooner appt.    I offered to call and make a sooner appt with PCP and suggested that he can  go to Urgent care today.  He states he will continue to monitor the pain and if worsens will go to urgent care.   Return in about 4 weeks (around 11/09/2023).Marland Kitchen He was informed of the importance of frequent follow up visits to maximize his success with intensive lifestyle modifications for his multiple health conditions.   ATTESTASTION STATEMENTS:  Reviewed by clinician on day of visit: allergies, medications, problem list, medical history, surgical history, family history, social history, and previous encounter notes.     Theodis Sato. Adlean Hardeman FNP-C

## 2023-10-14 DIAGNOSIS — R1032 Left lower quadrant pain: Secondary | ICD-10-CM | POA: Diagnosis not present

## 2023-10-14 DIAGNOSIS — K5903 Drug induced constipation: Secondary | ICD-10-CM | POA: Diagnosis not present

## 2023-10-14 DIAGNOSIS — Z79899 Other long term (current) drug therapy: Secondary | ICD-10-CM | POA: Diagnosis not present

## 2023-10-14 DIAGNOSIS — E1169 Type 2 diabetes mellitus with other specified complication: Secondary | ICD-10-CM | POA: Diagnosis not present

## 2023-11-10 DIAGNOSIS — E119 Type 2 diabetes mellitus without complications: Secondary | ICD-10-CM | POA: Diagnosis not present

## 2023-11-11 ENCOUNTER — Ambulatory Visit: Payer: BC Managed Care – PPO | Admitting: Nurse Practitioner

## 2023-11-15 ENCOUNTER — Other Ambulatory Visit: Payer: Self-pay | Admitting: Nurse Practitioner

## 2023-11-15 DIAGNOSIS — E119 Type 2 diabetes mellitus without complications: Secondary | ICD-10-CM

## 2023-11-15 MED ORDER — OZEMPIC (1 MG/DOSE) 4 MG/3ML ~~LOC~~ SOPN: 1.0000 mg | PEN_INJECTOR | SUBCUTANEOUS | 0 refills | Status: DC

## 2023-12-03 ENCOUNTER — Other Ambulatory Visit: Payer: Self-pay | Admitting: Nurse Practitioner

## 2023-12-03 DIAGNOSIS — R1032 Left lower quadrant pain: Secondary | ICD-10-CM | POA: Diagnosis not present

## 2023-12-03 DIAGNOSIS — E1169 Type 2 diabetes mellitus with other specified complication: Secondary | ICD-10-CM | POA: Diagnosis not present

## 2023-12-03 DIAGNOSIS — Z23 Encounter for immunization: Secondary | ICD-10-CM | POA: Diagnosis not present

## 2023-12-03 DIAGNOSIS — R39198 Other difficulties with micturition: Secondary | ICD-10-CM | POA: Diagnosis not present

## 2023-12-03 DIAGNOSIS — K5903 Drug induced constipation: Secondary | ICD-10-CM | POA: Diagnosis not present

## 2023-12-09 ENCOUNTER — Telehealth: Payer: BC Managed Care – PPO | Admitting: Nurse Practitioner

## 2023-12-09 ENCOUNTER — Encounter: Payer: Self-pay | Admitting: Nurse Practitioner

## 2023-12-09 ENCOUNTER — Ambulatory Visit: Payer: BC Managed Care – PPO | Admitting: Nurse Practitioner

## 2023-12-09 VITALS — BP 133/77 | HR 82 | Temp 98.2°F | Ht >= 80 in | Wt >= 6400 oz

## 2023-12-09 DIAGNOSIS — Z6841 Body Mass Index (BMI) 40.0 and over, adult: Secondary | ICD-10-CM

## 2023-12-09 DIAGNOSIS — E119 Type 2 diabetes mellitus without complications: Secondary | ICD-10-CM

## 2023-12-09 DIAGNOSIS — Z7984 Long term (current) use of oral hypoglycemic drugs: Secondary | ICD-10-CM

## 2023-12-09 DIAGNOSIS — Z7985 Long-term (current) use of injectable non-insulin antidiabetic drugs: Secondary | ICD-10-CM | POA: Diagnosis not present

## 2023-12-09 DIAGNOSIS — Z794 Long term (current) use of insulin: Secondary | ICD-10-CM

## 2023-12-09 NOTE — Patient Instructions (Signed)

## 2023-12-09 NOTE — Progress Notes (Signed)
Office: [phone]  /  Fax: [phone]  WEIGHT SUMMARY AND BIOMETRICS  Weight Lost Since Last Visit: 0lb  Weight Gained Since Last Visit: 4lb   Vitals Temp: 98.2 F (36.8 C) BP: 133/77 Pulse Rate: 82 SpO2: 95 %   Anthropometric Measurements Height: 6\' 8"  (2.032 m) Weight: (!) 424 lb (192.3 kg) BMI (Calculated): 46.58 Weight at Last Visit: 420lb Weight Lost Since Last Visit: 0lb Weight Gained Since Last Visit: 4lb Starting Weight: 443lb Total Weight Loss (lbs): 19 lb (8.618 kg)   Body Composition  Body Fat %: 43.9 % Fat Mass (lbs): 186.4 lbs Muscle Mass (lbs): 226.8 lbs Total Body Water (lbs): 174.4 lbs Visceral Fat Rating : 31   Other Clinical Data Fasting: No Labs: No Today's Visit #: 10 Starting Date: 02/18/23     HPI  Chief Complaint: OBESITY  Steve Rojas is here to discuss his progress with his obesity treatment plan. He is on the keeping a food journal and adhering to recommended goals of 2000 calories and 120-140 protein and states he is following his eating plan approximately 50 % of the time. He states he is exercising 0 minutes 0 days per week.   Interval History:  Since last office visit he has gained 4 pounds.  He was sick for 2-3 weeks over Christmas.  He he has gotten off track and hasn't been tracking. He has struggled with staying on track since August due to traveling.  He quit his job since his last visit. He is drinking water with flavoring, diet soda, hot tea and tea.  He struggles with knee pain.  He is not exercising.    Plans to travel to [facility] for his mother's 90th birthday in March  Previous pharmacotherapy for medical weight loss:  none   Bariatric surgery:  Patient has not had bariatric surgery.   Pharmacotherapy for DMT2:  He is taking Ozempic 0.5 mg, Actos 30mg , Amaryl 2mg  and Metformin XR 500mg  3 tablets.  Reports side effects of constipation but notes it is improving.  Taking a probiotic daily.  He started colace PRN and  recently started a fiber gummy after seeing his PCP on 12/03/23.  (See Note)   Last A1c was 6.9 on 09/27/23 Episodes of hypoglycemia: no On ASA 81mg  and statin.  Last eye exam:  2024 He has tried Ozempic in the past.  CBGs=am 117-155, pm 120s  No results found for: "HGBA1C" Lab Results  Component Value Date   LDLCALC 81 07/22/2023   CREATININE 0.89 07/22/2023      PHYSICAL EXAM:  Blood pressure 133/77, pulse 82, temperature 98.2 F (36.8 C), height 6\' 8"  (2.032 m), weight (!) 424 lb (192.3 kg), SpO2 95%. Body mass index is 46.58 kg/m.  General: He is overweight, cooperative, alert, well developed, and in no acute distress. PSYCH: Has normal mood, affect and thought process.   Extremities: No edema.  Neurologic: No gross sensory or motor deficits. No tremors or fasciculations noted.    DIAGNOSTIC DATA REVIEWED:  BMET    Component Value Date/Time   NA 138 07/22/2023 0933   K 4.4 07/22/2023 0933   CL 100 07/22/2023 0933   CO2 22 07/22/2023 0933   GLUCOSE 114 (H) 07/22/2023 0933   BUN 14 07/22/2023 0933   CREATININE 0.89 07/22/2023 0933   CALCIUM 9.8 07/22/2023 0933   No results found for: "HGBA1C" Lab Results  Component Value Date   INSULIN 9.4 02/18/2023   Lab Results  Component Value Date   TSH  3.200 07/22/2023   CBC    Component Value Date/Time   WBC 6.0 07/22/2023 0933   RBC 5.29 07/22/2023 0933   HGB 15.4 07/22/2023 0933   HCT 45.3 07/22/2023 0933   PLT 287 07/22/2023 0933   MCV 86 07/22/2023 0933   MCH 29.1 07/22/2023 0933   MCHC 34.0 07/22/2023 0933   RDW 13.3 07/22/2023 0933   Iron Studies No results found for: "IRON", "TIBC", "FERRITIN", "IRONPCTSAT" Lipid Panel     Component Value Date/Time   CHOL 142 07/22/2023 0933   TRIG 82 07/22/2023 0933   HDL 45 07/22/2023 0933   LDLCALC 81 07/22/2023 0933   Hepatic Function Panel     Component Value Date/Time   PROT 6.9 07/22/2023 0933   ALBUMIN 4.3 07/22/2023 0933   AST 13 07/22/2023 0933    ALT 21 07/22/2023 0933   ALKPHOS 102 07/22/2023 0933   BILITOT 0.9 07/22/2023 0933      Component Value Date/Time   TSH 3.200 07/22/2023 0933   Nutritional Lab Results  Component Value Date   VD25OH 43.2 07/22/2023   VD25OH 22.5 (L) 02/18/2023     ASSESSMENT AND PLAN  TREATMENT PLAN FOR OBESITY:  Recommended Dietary Goals  Keeshawn is currently in the action stage of change. As such, his goal is to continue weight management plan. He has agreed to restart tracking and will review at his next visit. Aim 2000-2400 calories and 120-140 grams of protein.  Behavioral Intervention  We discussed the following Behavioral Modification Strategies today: increasing lean protein intake to established goals, decreasing simple carbohydrates , increasing vegetables, increasing water intake , work on meal planning and preparation, work on tracking and journaling calories using tracking application, reading food labels , avoiding temptations and identifying enticing environmental cues, continue to work on implementation of reduced calorie nutritional plan, continue to practice mindfulness when eating, planning for success, and continue to work on maintaining a reduced calorie state, getting the recommended amount of protein, incorporating whole foods, making healthy choices, staying well hydrated and practicing mindfulness when eating..  Additional resources provided today: NA  Recommended Physical Activity Goals  Sami has been advised to work up to 150 minutes of moderate intensity aerobic activity a week and strengthening exercises 2-3 times per week for cardiovascular health, weight loss maintenance and preservation of muscle mass.   He has agreed to Think about enjoyable ways to increase daily physical activity and overcoming barriers to exercise, Increase physical activity in their day and reduce sedentary time (increase NEAT)., and Work on scheduling and tracking physical activity.      ASSOCIATED CONDITIONS ADDRESSED TODAY  Action/Plan  Type 2 diabetes mellitus without complication, without long-term current use of insulin (HCC)  Options discussed today: Stay on Ozempic 0.5mg  Stopping Ozempic for the next month to see if symptoms resolve.  See note from PCP on 12/03/23 Stopping Ozempic and starting [facility]   Patient would like to discuss with PCP and we will re discuss at next visit. Has appt with PCP for follow up on 12/29/23.  Plans to stay on Ozempic 0.5mg  until next visit.    Morbid obesity (HCC)  BMI 45.0-49.9, adult (HCC)      Goal: Start tracking Increase water Start exercising.     Return in about 4 weeks (around 01/06/2024).Marland Kitchen He was informed of the importance of frequent follow up visits to maximize his success with intensive lifestyle modifications for his multiple health conditions.   ATTESTASTION STATEMENTS:  Reviewed by clinician on  day of visit: allergies, medications, problem list, medical history, surgical history, family history, social history, and previous encounter notes.   Time spent on visit including pre-visit chart review and post-visit care and charting was 30 minutes.    Theodis Sato. Henri Baumler FNP-C

## 2023-12-11 DIAGNOSIS — E119 Type 2 diabetes mellitus without complications: Secondary | ICD-10-CM | POA: Diagnosis not present

## 2023-12-29 DIAGNOSIS — I152 Hypertension secondary to endocrine disorders: Secondary | ICD-10-CM | POA: Diagnosis not present

## 2023-12-29 DIAGNOSIS — E1169 Type 2 diabetes mellitus with other specified complication: Secondary | ICD-10-CM | POA: Diagnosis not present

## 2023-12-29 DIAGNOSIS — Z0000 Encounter for general adult medical examination without abnormal findings: Secondary | ICD-10-CM | POA: Diagnosis not present

## 2023-12-29 DIAGNOSIS — K5903 Drug induced constipation: Secondary | ICD-10-CM | POA: Diagnosis not present

## 2024-01-08 DIAGNOSIS — E119 Type 2 diabetes mellitus without complications: Secondary | ICD-10-CM | POA: Diagnosis not present

## 2024-01-12 ENCOUNTER — Encounter: Payer: Self-pay | Admitting: Nurse Practitioner

## 2024-01-12 ENCOUNTER — Ambulatory Visit: Payer: BC Managed Care – PPO | Admitting: Nurse Practitioner

## 2024-01-12 VITALS — BP 131/76 | HR 92 | Temp 98.0°F | Ht >= 80 in | Wt >= 6400 oz

## 2024-01-12 DIAGNOSIS — Z7985 Long-term (current) use of injectable non-insulin antidiabetic drugs: Secondary | ICD-10-CM

## 2024-01-12 DIAGNOSIS — E119 Type 2 diabetes mellitus without complications: Secondary | ICD-10-CM | POA: Diagnosis not present

## 2024-01-12 DIAGNOSIS — E66813 Obesity, class 3: Secondary | ICD-10-CM | POA: Diagnosis not present

## 2024-01-12 DIAGNOSIS — Z6841 Body Mass Index (BMI) 40.0 and over, adult: Secondary | ICD-10-CM

## 2024-01-12 MED ORDER — TIRZEPATIDE 2.5 MG/0.5ML ~~LOC~~ SOAJ: 2.5000 mg | SUBCUTANEOUS | 0 refills | Status: DC

## 2024-01-12 NOTE — Patient Instructions (Signed)

## 2024-01-12 NOTE — Progress Notes (Signed)
 Office: [phone]  /  Fax: [phone]  WEIGHT SUMMARY AND BIOMETRICS  Weight Lost Since Last Visit: 0lb  Weight Gained Since Last Visit: 6lb   Vitals Temp: 98 F (36.7 C) BP: 131/76 Pulse Rate: 92 SpO2: 95 %   Anthropometric Measurements Height: 6\' 8"  (2.032 m) Weight: (!) 430 lb (195 kg) BMI (Calculated): 47.24 Weight at Last Visit: 424lb Weight Lost Since Last Visit: 0lb Weight Gained Since Last Visit: 6lb Starting Weight: 443lb Total Weight Loss (lbs): 13 lb (5.897 kg)   No data recorded Other Clinical Data Fasting: No Labs: No Today's Visit #: 11 Starting Date: 02/18/23     HPI  Chief Complaint: OBESITY  Steve Rojas is here to discuss his progress with his obesity treatment plan. He is on the keeping a food journal and adhering to recommended goals of 2000 calories and 120-140 protein and states he is following his eating plan approximately 60 % of the time. He states he is exercising 60 minutes 2-3 days per week.   Interval History:  Since last office visit he has gained 6 pounds.  He notes that he is under a lot of stress with what is going on in the world.  He has struggling with focusing.  He feels that he is getting back on track.  His abd pain has subsided.  He has had multiple celebrations over the past month.  He is exercising 1 day per week but plans to start going to the gym more.  Recently started back swimming.   Saw PCP last on 12/29/23  Calories:  [id number] Protein:  114-190  Previous pharmacotherapy for medical weight loss:  none   Bariatric surgery:  Patient has not had bariatric surgery.   Pharmacotherapy for DMT2:  He is currently taking Ozempic 0.75 mg, Actos 30mg , Amaryl 2mg  and Metformin XR 500mg  3 tablets.  Constipation has improved.    Last A1c was 6.9 CBGs: morning:  117-168. Noon:  127, night:  123 Episodes of hypoglycemia: no Taking ASA 81mg  and statin.  Last eye exam:  2024 He has tried Ozempic in the past.    No  results found for: "HGBA1C" Lab Results  Component Value Date   LDLCALC 81 07/22/2023   CREATININE 0.89 07/22/2023      PHYSICAL EXAM:  Blood pressure 131/76, pulse 92, temperature 98 F (36.7 C), height 6\' 8"  (2.032 m), weight (!) 430 lb (195 kg), SpO2 95%. Body mass index is 47.24 kg/m.  General: He is overweight, cooperative, alert, well developed, and in no acute distress. PSYCH: Has normal mood, affect and thought process.   Extremities: No edema.  Neurologic: No gross sensory or motor deficits. No tremors or fasciculations noted.    DIAGNOSTIC DATA REVIEWED:  BMET    Component Value Date/Time   NA 138 07/22/2023 0933   K 4.4 07/22/2023 0933   CL 100 07/22/2023 0933   CO2 22 07/22/2023 0933   GLUCOSE 114 (H) 07/22/2023 0933   BUN 14 07/22/2023 0933   CREATININE 0.89 07/22/2023 0933   CALCIUM 9.8 07/22/2023 0933   No results found for: "HGBA1C" Lab Results  Component Value Date   INSULIN 9.4 02/18/2023   Lab Results  Component Value Date   TSH 3.200 07/22/2023   CBC    Component Value Date/Time   WBC 6.0 07/22/2023 0933   RBC 5.29 07/22/2023 0933   HGB 15.4 07/22/2023 0933   HCT 45.3 07/22/2023 0933   PLT 287 07/22/2023 0933   MCV 86  07/22/2023 0933   MCH 29.1 07/22/2023 0933   MCHC 34.0 07/22/2023 0933   RDW 13.3 07/22/2023 0933   Iron Studies No results found for: "IRON", "TIBC", "FERRITIN", "IRONPCTSAT" Lipid Panel     Component Value Date/Time   CHOL 142 07/22/2023 0933   TRIG 82 07/22/2023 0933   HDL 45 07/22/2023 0933   LDLCALC 81 07/22/2023 0933   Hepatic Function Panel     Component Value Date/Time   PROT 6.9 07/22/2023 0933   ALBUMIN 4.3 07/22/2023 0933   AST 13 07/22/2023 0933   ALT 21 07/22/2023 0933   ALKPHOS 102 07/22/2023 0933   BILITOT 0.9 07/22/2023 0933      Component Value Date/Time   TSH 3.200 07/22/2023 0933   Nutritional Lab Results  Component Value Date   VD25OH 43.2 07/22/2023   VD25OH 22.5 (L) 02/18/2023      ASSESSMENT AND PLAN  TREATMENT PLAN FOR OBESITY:  Recommended Dietary Goals  Steve Rojas is currently in the action stage of change. As such, his goal is to continue weight management plan. He has agreed to track and will review macros at next visit.  Behavioral Intervention  We discussed the following Behavioral Modification Strategies today: increasing lean protein intake to established goals, decreasing simple carbohydrates , increasing vegetables, increasing fiber rich foods, increasing water intake , work on meal planning and preparation, work on Counselling psychologist calories using tracking application, reading food labels , keeping healthy foods at home, continue to work on implementation of reduced calorie nutritional plan, continue to practice mindfulness when eating, and continue to work on maintaining a reduced calorie state, getting the recommended amount of protein, incorporating whole foods, making healthy choices, staying well hydrated and practicing mindfulness when eating..  Additional resources provided today: NA  Recommended Physical Activity Goals  Steve Rojas has been advised to work up to 150 minutes of moderate intensity aerobic activity a week and strengthening exercises 2-3 times per week for cardiovascular health, weight loss maintenance and preservation of muscle mass.   He has agreed to Think about enjoyable ways to increase daily physical activity and overcoming barriers to exercise, Increase physical activity in their day and reduce sedentary time (increase NEAT)., Increase the intensity, frequency or duration of strengthening exercises , and Increase the intensity, frequency or duration of aerobic exercises      ASSOCIATED CONDITIONS ADDRESSED TODAY  Action/Plan  Type 2 diabetes mellitus without complication, without long-term current use of insulin (HCC) -     Stop Ozempic due to side effects and start Tirzepatide; Inject 2.5 mg into the skin once a week.   Dispense: 2 mL; Refill: 0.  Side effects discussed.    Class 3 severe obesity due to excess calories with serious comorbidity and body mass index (BMI) of 45.0 to 49.9 in adult [facility])     Will recheck labs in 1-3 months.     Return in about 4 weeks (around 02/09/2024).Marland Kitchen He was informed of the importance of frequent follow up visits to maximize his success with intensive lifestyle modifications for his multiple health conditions.   ATTESTASTION STATEMENTS:  Reviewed by clinician on day of visit: allergies, medications, problem list, medical history, surgical history, family history, social history, and previous encounter notes.     Theodis Sato. Onalee Steinbach FNP-C

## 2024-01-19 ENCOUNTER — Encounter: Payer: Self-pay | Admitting: Nurse Practitioner

## 2024-01-19 NOTE — Telephone Encounter (Signed)
**Note De-identified  [place] Obfuscation** Please advise 

## 2024-02-08 DIAGNOSIS — E119 Type 2 diabetes mellitus without complications: Secondary | ICD-10-CM | POA: Diagnosis not present

## 2024-02-10 ENCOUNTER — Ambulatory Visit: Admitting: Nurse Practitioner

## 2024-02-10 ENCOUNTER — Encounter: Payer: Self-pay | Admitting: Nurse Practitioner

## 2024-02-10 VITALS — BP 124/77 | HR 65 | Temp 97.7°F | Ht >= 80 in | Wt >= 6400 oz

## 2024-02-10 DIAGNOSIS — Z7985 Long-term (current) use of injectable non-insulin antidiabetic drugs: Secondary | ICD-10-CM | POA: Diagnosis not present

## 2024-02-10 DIAGNOSIS — Z6841 Body Mass Index (BMI) 40.0 and over, adult: Secondary | ICD-10-CM | POA: Diagnosis not present

## 2024-02-10 DIAGNOSIS — E66813 Obesity, class 3: Secondary | ICD-10-CM | POA: Diagnosis not present

## 2024-02-10 DIAGNOSIS — E119 Type 2 diabetes mellitus without complications: Secondary | ICD-10-CM | POA: Diagnosis not present

## 2024-02-10 DIAGNOSIS — Z7984 Long term (current) use of oral hypoglycemic drugs: Secondary | ICD-10-CM

## 2024-02-10 NOTE — Progress Notes (Signed)
 Office: [phone]  /  Fax: [phone]  WEIGHT SUMMARY AND BIOMETRICS  Weight Lost Since Last Visit: 0lb  Weight Gained Since Last Visit: 15lb   Vitals Temp: 97.7 F (36.5 C) BP: 124/77 Pulse Rate: 65 SpO2: 95 %   Anthropometric Measurements Height: 6\' 8"  (2.032 m) Weight: (!) 445 lb (201.9 kg) BMI (Calculated): 48.89 Weight at Last Visit: 430lb Weight Lost Since Last Visit: 0lb Weight Gained Since Last Visit: 15lb Starting Weight: 443lb Total Weight Loss (lbs): 0 lb (0 kg)   Body Composition  Body Fat %: 46.2 % Fat Mass (lbs): 205.6 lbs Muscle Mass (lbs): 227.8 lbs Total Body Water (lbs): 191 lbs Visceral Fat Rating : 34   Other Clinical Data Fasting: Yes Labs: No Today's Visit #: 12 Starting Date: 02/18/23     HPI  Chief Complaint: OBESITY  Steve Rojas is here to discuss his progress with his obesity treatment plan. He is on the keeping a food journal and adhering to recommended goals of 2000 calories and 120-140 protein and states he is following his eating plan approximately 50 % of the time. He states he is exercising 60 minutes 3 days per week.   Interval History:  Since last office visit he has gained 15 pounds.  He has been on multiple vacations since his last visit. He hasn't been tracking as he has in the past.  He notes he is eating too  much junk food, carbs and has gotten off track.  He is drinking "too much diet sodas", tea with splenda and zero sugar grape drink.   Previous pharmacotherapy for medical weight loss:  none   Bariatric surgery:  Patient has not had bariatric surgery.   Pharmacotherapy for DMT2:   He is currently taking Mounjaro 2.5mg  x 1 dose, Actos 30mg , Amaryl 2mg  and Metformin XR 500mg  2 tablets am and 1 pm.  Denies side effects.     Last A1c was 6.9 on 09/27/23 CBGs: morning:  87-148 Episodes of hypoglycemia: None Taking ASA 81mg  and statin.  Last eye exam:  2024 He has tried Ozempic in the past.    No results found  for: "HGBA1C" Lab Results  Component Value Date   LDLCALC 81 07/22/2023   CREATININE 0.89 07/22/2023      PHYSICAL EXAM:  Blood pressure 124/77, pulse 65, temperature 97.7 F (36.5 C), height 6\' 8"  (2.032 m), weight (!) 445 lb (201.9 kg), SpO2 95%. Body mass index is 48.89 kg/m.  General: He is overweight, cooperative, alert, well developed, and in no acute distress. PSYCH: Has normal mood, affect and thought process.   Extremities: No edema.  Neurologic: No gross sensory or motor deficits. No tremors or fasciculations noted.    DIAGNOSTIC DATA REVIEWED:  BMET    Component Value Date/Time   NA 138 07/22/2023 0933   K 4.4 07/22/2023 0933   CL 100 07/22/2023 0933   CO2 22 07/22/2023 0933   GLUCOSE 114 (H) 07/22/2023 0933   BUN 14 07/22/2023 0933   CREATININE 0.89 07/22/2023 0933   CALCIUM 9.8 07/22/2023 0933   No results found for: "HGBA1C" Lab Results  Component Value Date   INSULIN 9.4 02/18/2023   Lab Results  Component Value Date   TSH 3.200 07/22/2023   CBC    Component Value Date/Time   WBC 6.0 07/22/2023 0933   RBC 5.29 07/22/2023 0933   HGB 15.4 07/22/2023 0933   HCT 45.3 07/22/2023 0933   PLT 287 07/22/2023 0933   MCV 86  07/22/2023 0933   MCH 29.1 07/22/2023 0933   MCHC 34.0 07/22/2023 0933   RDW 13.3 07/22/2023 0933   Iron Studies No results found for: "IRON", "TIBC", "FERRITIN", "IRONPCTSAT" Lipid Panel     Component Value Date/Time   CHOL 142 07/22/2023 0933   TRIG 82 07/22/2023 0933   HDL 45 07/22/2023 0933   LDLCALC 81 07/22/2023 0933   Hepatic Function Panel     Component Value Date/Time   PROT 6.9 07/22/2023 0933   ALBUMIN 4.3 07/22/2023 0933   AST 13 07/22/2023 0933   ALT 21 07/22/2023 0933   ALKPHOS 102 07/22/2023 0933   BILITOT 0.9 07/22/2023 0933      Component Value Date/Time   TSH 3.200 07/22/2023 0933   Nutritional Lab Results  Component Value Date   VD25OH 43.2 07/22/2023   VD25OH 22.5 (L) 02/18/2023      ASSESSMENT AND PLAN  TREATMENT PLAN FOR OBESITY:  Recommended Dietary Goals  Steve Rojas is currently in the action stage of change. As such, his goal is to continue weight management plan. He has agreed to to start tracking and will review at next visit.  Behavioral Intervention  We discussed the following Behavioral Modification Strategies today: increasing lean protein intake to established goals, decreasing simple carbohydrates , increasing vegetables, increasing fiber rich foods, increasing water intake , reading food labels , keeping healthy foods at home, and continue to work on maintaining a reduced calorie state, getting the recommended amount of protein, incorporating whole foods, making healthy choices, staying well hydrated and practicing mindfulness when eating..  Additional resources provided today: NA  Recommended Physical Activity Goals  Steve Rojas has been advised to work up to 150 minutes of moderate intensity aerobic activity a week and strengthening exercises 2-3 times per week for cardiovascular health, weight loss maintenance and preservation of muscle mass.   He has agreed to Think about enjoyable ways to increase daily physical activity and overcoming barriers to exercise, Increase physical activity in their day and reduce sedentary time (increase NEAT)., and Work on scheduling and tracking physical activity.    ASSOCIATED CONDITIONS ADDRESSED TODAY  Action/Plan  Type 2 diabetes mellitus without complication, without long-term current use of insulin (HCC) Will continue to monitor.  Continue meds as directed  Good blood sugar control is important to decrease the likelihood of diabetic complications such as nephropathy, neuropathy, limb loss, blindness, coronary artery disease, and death. Intensive lifestyle modification including diet, exercise and weight loss are the first line of treatment for diabetes.    Class 3 severe obesity due to excess calories with serious  comorbidity and body mass index (BMI) of 45.0 to 49.9 in adult [facility])    Has appt with PCP on 03/28/24 for follow up and labs.     Goals:  Start exercising on a regular basis Watch portion sizes Start tracking   Return in about 3 weeks (around 03/02/2024).Marland Kitchen He was informed of the importance of frequent follow up visits to maximize his success with intensive lifestyle modifications for his multiple health conditions.   ATTESTASTION STATEMENTS:  Reviewed by clinician on day of visit: allergies, medications, problem list, medical history, surgical history, family history, social history, and previous encounter notes.   Time spent on visit including pre-visit chart review and post-visit care and charting was 30 minutes.    Theodis Sato. Myleen Brailsford FNP-C

## 2024-03-02 ENCOUNTER — Encounter: Payer: Self-pay | Admitting: Nurse Practitioner

## 2024-03-02 ENCOUNTER — Ambulatory Visit: Admitting: Nurse Practitioner

## 2024-03-02 VITALS — BP 139/88 | HR 73 | Temp 98.1°F | Ht >= 80 in | Wt >= 6400 oz

## 2024-03-02 DIAGNOSIS — Z7985 Long-term (current) use of injectable non-insulin antidiabetic drugs: Secondary | ICD-10-CM | POA: Diagnosis not present

## 2024-03-02 DIAGNOSIS — Z7984 Long term (current) use of oral hypoglycemic drugs: Secondary | ICD-10-CM

## 2024-03-02 DIAGNOSIS — Z6841 Body Mass Index (BMI) 40.0 and over, adult: Secondary | ICD-10-CM | POA: Diagnosis not present

## 2024-03-02 DIAGNOSIS — Z794 Long term (current) use of insulin: Secondary | ICD-10-CM

## 2024-03-02 DIAGNOSIS — E66813 Obesity, class 3: Secondary | ICD-10-CM | POA: Diagnosis not present

## 2024-03-02 DIAGNOSIS — E119 Type 2 diabetes mellitus without complications: Secondary | ICD-10-CM

## 2024-03-02 MED ORDER — TIRZEPATIDE 5 MG/0.5ML ~~LOC~~ SOAJ: 5.0000 mg | SUBCUTANEOUS | 0 refills | Status: DC

## 2024-03-02 NOTE — Progress Notes (Signed)
 Office: [phone]  /  Fax: [phone]  WEIGHT SUMMARY AND BIOMETRICS  Weight Lost Since Last Visit: 0lb  Weight Gained Since Last Visit: 0lb   Vitals Temp: 98.1 F (36.7 C) BP: 139/88 Pulse Rate: 73 SpO2: 97 %   Anthropometric Measurements Height: 6\' 8"  (2.032 m) Weight: (!) 445 lb (201.9 kg) BMI (Calculated): 48.89 Weight at Last Visit: 445lb Weight Lost Since Last Visit: 0lb Weight Gained Since Last Visit: 0lb Starting Weight: 443lb Total Weight Loss (lbs): 0 lb (0 kg)   Body Composition  Body Fat %: 46.1 % Fat Mass (lbs): 205.4 lbs Muscle Mass (lbs): 228.4 lbs Total Body Water (lbs): 192.8 lbs Visceral Fat Rating : 34   Other Clinical Data Fasting: No Labs: No Today's Visit #: 13 Starting Date: 02/18/23     HPI  Chief Complaint: OBESITY  Steve Rojas is here to discuss his progress with his obesity treatment plan. He is on the keeping a food journal and adhering to recommended goals of 2000 calories and 120-140 protein and states he is following his eating plan approximately 80 % of the time. He states he is exercising 60 minutes 3-4 days per week.   Interval History:  Since last office visit he has maintained his weight.  He is averaging around 1201-3024 calories, 66-147 protein.  He has been eating too many carbs.  He is trying to increase his protein and calories and decrease his carb  intake. He is trying to decrease his portion sizes.  He has had several celebrations since his last visit.  He is drinking water, sweet tea and diet coke.   No upcoming celebrations so feels he will be able to focus more upon his health, exercise and diet.    Previous pharmacotherapy for medical weight loss:  none   Bariatric surgery:  Patient has not had bariatric surgery.   Pharmacotherapy for DMT2:   He is currently taking Mounjaro 2.5mg  dose, Actos 30mg , Amaryl 2mg  and Metformin  XR 500mg  1 tablets am and 2 pm.  Denies side effects.  Had one episode of diarrhea.   Last A1c was 6.9 on 09/27/23                                                                                                                                           CBGs: morning:  82-129, noon 92-139 Episodes of hypoglycemia: None Taking ASA 81mg  and statin.  Last eye exam:  2024 He has tried Ozempic  in the past.     No results found for: "HGBA1C" Lab Results  Component Value Date   LDLCALC 81 07/22/2023   CREATININE 0.89 07/22/2023      PHYSICAL EXAM:  Blood pressure 139/88, pulse 73, temperature 98.1 F (36.7 C), height 6\' 8"  (2.032 m), weight (!) 445 lb (201.9 kg), SpO2 97%. Body mass index is 48.89 kg/m.  General: He is overweight, cooperative, alert,  well developed, and in no acute distress. PSYCH: Has normal mood, affect and thought process.   Extremities: No edema.  Neurologic: No gross sensory or motor deficits. No tremors or fasciculations noted.    DIAGNOSTIC DATA REVIEWED:  BMET    Component Value Date/Time   NA 138 07/22/2023 0933   K 4.4 07/22/2023 0933   CL 100 07/22/2023 0933   CO2 22 07/22/2023 0933   GLUCOSE 114 (H) 07/22/2023 0933   BUN 14 07/22/2023 0933   CREATININE 0.89 07/22/2023 0933   CALCIUM 9.8 07/22/2023 0933   No results found for: "HGBA1C" Lab Results  Component Value Date   INSULIN  9.4 02/18/2023   Lab Results  Component Value Date   TSH 3.200 07/22/2023   CBC    Component Value Date/Time   WBC 6.0 07/22/2023 0933   RBC 5.29 07/22/2023 0933   HGB 15.4 07/22/2023 0933   HCT 45.3 07/22/2023 0933   PLT 287 07/22/2023 0933   MCV 86 07/22/2023 0933   MCH 29.1 07/22/2023 0933   MCHC 34.0 07/22/2023 0933   RDW 13.3 07/22/2023 0933   Iron Studies No results found for: "IRON", "TIBC", "FERRITIN", "IRONPCTSAT" Lipid Panel     Component Value Date/Time   CHOL 142 07/22/2023 0933   TRIG 82 07/22/2023 0933   HDL 45 07/22/2023 0933   LDLCALC 81 07/22/2023 0933   Hepatic Function Panel     Component Value Date/Time   PROT  6.9 07/22/2023 0933   ALBUMIN 4.3 07/22/2023 0933   AST 13 07/22/2023 0933   ALT 21 07/22/2023 0933   ALKPHOS 102 07/22/2023 0933   BILITOT 0.9 07/22/2023 0933      Component Value Date/Time   TSH 3.200 07/22/2023 0933   Nutritional Lab Results  Component Value Date   VD25OH 43.2 07/22/2023   VD25OH 22.5 (L) 02/18/2023     ASSESSMENT AND PLAN  TREATMENT PLAN FOR OBESITY:  Recommended Dietary Goals  Jt is currently in the action stage of change. As such, his goal is to continue weight management plan. He has agreed to track and will send me macros next week via mychart.  Behavioral Intervention  We discussed the following Behavioral Modification Strategies today: increasing lean protein intake to established goals, decreasing simple carbohydrates , increasing vegetables, increasing fiber rich foods, increasing water intake , work on meal planning and preparation, work on tracking and journaling calories using tracking application, and continue to work on maintaining a reduced calorie state, getting the recommended amount of protein, incorporating whole foods, making healthy choices, staying well hydrated and practicing mindfulness when eating..  Additional resources provided today: NA  Recommended Physical Activity Goals  Kanaan has been advised to work up to 150 minutes of moderate intensity aerobic activity a week and strengthening exercises 2-3 times per week for cardiovascular health, weight loss maintenance and preservation of muscle mass.   He has agreed to Think about enjoyable ways to increase daily physical activity and overcoming barriers to exercise, Increase physical activity in their day and reduce sedentary time (increase NEAT)., Increase the intensity, frequency or duration of strengthening exercises , and Increase the intensity, frequency or duration of aerobic exercises     ASSOCIATED CONDITIONS ADDRESSED TODAY  Action/Plan  Type 2 diabetes mellitus  without complication, without long-term current use of insulin  (HCC) -     Increase Tirzepatide ; Inject 5 mg into the skin once a week.  Dispense: 2 mL; Refill: 0. Side effects discussed  To continue monitoring CBGs  and to send me blood sugar readings via mychart next week.  Monitor for hypoglycemia.    Good blood sugar control is important to decrease the likelihood of diabetic complications such as nephropathy, neuropathy, limb loss, blindness, coronary artery disease, and death. Intensive lifestyle modification including diet, exercise and weight loss are the first line of treatment for diabetes.    Class 3 severe obesity due to excess calories with serious comorbidity and body mass index (BMI) of 45.0 to 49.9 in adult [facility])  Goals: Continue exercising, start swimming Continue working on decreasing portion sizes and carbs Continue tracking and send to my via mychart weekly      Seeing PCP on May 20th for follow up and labs.    Return in about 4 weeks (around 03/30/2024).Aaron Aas He was informed of the importance of frequent follow up visits to maximize his success with intensive lifestyle modifications for his multiple health conditions.   ATTESTASTION STATEMENTS:  Reviewed by clinician on day of visit: allergies, medications, problem list, medical history, surgical history, family history, social history, and previous encounter notes.     Crist Dominion. Sumi Lye FNP-C

## 2024-03-09 DIAGNOSIS — E119 Type 2 diabetes mellitus without complications: Secondary | ICD-10-CM | POA: Diagnosis not present

## 2024-03-28 DIAGNOSIS — E1169 Type 2 diabetes mellitus with other specified complication: Secondary | ICD-10-CM | POA: Diagnosis not present

## 2024-03-28 DIAGNOSIS — E538 Deficiency of other specified B group vitamins: Secondary | ICD-10-CM | POA: Diagnosis not present

## 2024-03-28 DIAGNOSIS — E559 Vitamin D deficiency, unspecified: Secondary | ICD-10-CM | POA: Diagnosis not present

## 2024-03-28 DIAGNOSIS — Z6841 Body Mass Index (BMI) 40.0 and over, adult: Secondary | ICD-10-CM | POA: Diagnosis not present

## 2024-03-28 DIAGNOSIS — I152 Hypertension secondary to endocrine disorders: Secondary | ICD-10-CM | POA: Diagnosis not present

## 2024-03-28 DIAGNOSIS — Z79899 Other long term (current) drug therapy: Secondary | ICD-10-CM | POA: Diagnosis not present

## 2024-03-28 DIAGNOSIS — Z1331 Encounter for screening for depression: Secondary | ICD-10-CM | POA: Diagnosis not present

## 2024-03-29 ENCOUNTER — Encounter: Payer: Self-pay | Admitting: Nurse Practitioner

## 2024-03-29 ENCOUNTER — Ambulatory Visit: Admitting: Nurse Practitioner

## 2024-03-29 VITALS — BP 147/94 | HR 73 | Temp 97.7°F | Ht >= 80 in | Wt >= 6400 oz

## 2024-03-29 DIAGNOSIS — Z7985 Long-term (current) use of injectable non-insulin antidiabetic drugs: Secondary | ICD-10-CM

## 2024-03-29 DIAGNOSIS — E559 Vitamin D deficiency, unspecified: Secondary | ICD-10-CM

## 2024-03-29 DIAGNOSIS — E66813 Obesity, class 3: Secondary | ICD-10-CM | POA: Diagnosis not present

## 2024-03-29 DIAGNOSIS — E119 Type 2 diabetes mellitus without complications: Secondary | ICD-10-CM

## 2024-03-29 DIAGNOSIS — Z6841 Body Mass Index (BMI) 40.0 and over, adult: Secondary | ICD-10-CM

## 2024-03-29 DIAGNOSIS — Z7984 Long term (current) use of oral hypoglycemic drugs: Secondary | ICD-10-CM

## 2024-03-29 MED ORDER — TIRZEPATIDE 7.5 MG/0.5ML ~~LOC~~ SOAJ: 7.5000 mg | SUBCUTANEOUS | 0 refills | Status: DC

## 2024-03-29 NOTE — Progress Notes (Signed)
 Office: [phone]  /  Fax: [phone]  WEIGHT SUMMARY AND BIOMETRICS  Weight Lost Since Last Visit: 0lb  Weight Gained Since Last Visit: 0lb   Vitals Temp: 97.7 F (36.5 C) BP: (!) 147/94 Pulse Rate: 73 SpO2: 97 %   Anthropometric Measurements Height: 6\' 8"  (2.032 m) Weight: (!) 445 lb (201.9 kg) BMI (Calculated): 48.89 Weight at Last Visit: 445lb Weight Lost Since Last Visit: 0lb Weight Gained Since Last Visit: 0lb Starting Weight: 443lb Total Weight Loss (lbs): 0 lb (0 kg)   No data recorded Other Clinical Data Fasting: No Labs: No Today's Visit #: 14 Starting Date: 02/18/23     HPI  Chief Complaint: OBESITY  Steve Rojas is here to discuss his progress with his obesity treatment plan. He is on the keeping a food journal and adhering to recommended goals of 2000 calories and 120-140 protein and states he is following his eating plan approximately 50 % of the time. He states he is exercising 1-2 days per week.   Interval History:  Since last office visit he has maintained his weight.  He reports "I'm a train wreck".  He says his home life is chaotic and he's not motivated.  He knows that "I've got to do this and I can't put my finger on it".   He says that is he would have stayed focused here, he feels that he would have lost weight by now.  He is cooking multiple meals daily because everyone eats differently.  Because of that, the cost of food is more.  Due to the cost, he's eating noodles and potatoes due to being cheaper.   He reports that he loves to eat and notes that eating is associated with certain things such as watching tv. He is eating when he's bored. He loves to model railroading and has gotten back into it.  He is planning to make a hobby room. He's hopeful his hobby room will help to reduce his stress at home/stress eating.    Previous pharmacotherapy for medical weight loss:  none   Bariatric surgery:  Patient has not had bariatric surgery.     Pharmacotherapy for DMT2:   He is currently taking Mounjaro 5mg  dose, Actos 30mg , Amaryl 2mg  and Metformin  XR 500mg  1 tablets am and 2 pm.  Occ side effects of diarrhea based upon what he is eating.  Last A1c was 7.1                                                                                                                                          CBGs: 82-170 (high due to eating cake) Episodes of hypoglycemia: None Taking ASA 81mg  and statin.  Last eye exam:  2024-plans to sched He has tried Ozempic  in the past.    No results found for: "HGBA1C" Lab Results  Component Value Date   LDLCALC 81 07/22/2023  CREATININE 0.89 07/22/2023    Vit D deficiency  He is taking Vit D 50,000 IU weekly (prescribed by PCP).  Denies side effects.  Denies nausea, vomiting or muscle weakness.    Last Vit D was 38.7 on 03/28/24 (see in care everywhere)  Lab Results  Component Value Date   VD25OH 43.2 07/22/2023   VD25OH 22.5 (L) 02/18/2023     PHYSICAL EXAM:  Blood pressure (!) 147/94, pulse 73, temperature 97.7 F (36.5 C), height 6\' 8"  (2.032 m), weight (!) 445 lb (201.9 kg), SpO2 97%. Body mass index is 48.89 kg/m.  General: He is overweight, cooperative, alert, well developed, and in no acute distress. PSYCH: Has normal mood, affect and thought process.   Extremities: No edema.  Neurologic: No gross sensory or motor deficits. No tremors or fasciculations noted.    DIAGNOSTIC DATA REVIEWED:  BMET    Component Value Date/Time   NA 138 07/22/2023 0933   K 4.4 07/22/2023 0933   CL 100 07/22/2023 0933   CO2 22 07/22/2023 0933   GLUCOSE 114 (H) 07/22/2023 0933   BUN 14 07/22/2023 0933   CREATININE 0.89 07/22/2023 0933   CALCIUM 9.8 07/22/2023 0933   No results found for: "HGBA1C" Lab Results  Component Value Date   INSULIN  9.4 02/18/2023   Lab Results  Component Value Date   TSH 3.200 07/22/2023   CBC    Component Value Date/Time   WBC 6.0 07/22/2023 0933   RBC 5.29  07/22/2023 0933   HGB 15.4 07/22/2023 0933   HCT 45.3 07/22/2023 0933   PLT 287 07/22/2023 0933   MCV 86 07/22/2023 0933   MCH 29.1 07/22/2023 0933   MCHC 34.0 07/22/2023 0933   RDW 13.3 07/22/2023 0933   Iron Studies No results found for: "IRON", "TIBC", "FERRITIN", "IRONPCTSAT" Lipid Panel     Component Value Date/Time   CHOL 142 07/22/2023 0933   TRIG 82 07/22/2023 0933   HDL 45 07/22/2023 0933   LDLCALC 81 07/22/2023 0933   Hepatic Function Panel     Component Value Date/Time   PROT 6.9 07/22/2023 0933   ALBUMIN 4.3 07/22/2023 0933   AST 13 07/22/2023 0933   ALT 21 07/22/2023 0933   ALKPHOS 102 07/22/2023 0933   BILITOT 0.9 07/22/2023 0933      Component Value Date/Time   TSH 3.200 07/22/2023 0933   Nutritional Lab Results  Component Value Date   VD25OH 43.2 07/22/2023   VD25OH 22.5 (L) 02/18/2023     ASSESSMENT AND PLAN  TREATMENT PLAN FOR OBESITY:  Recommended Dietary Goals  Steve Rojas is currently in the action stage of change. As such, his goal is to continue weight management plan. He has agreed to track and will review at next visit.  Behavioral Intervention  We discussed the following Behavioral Modification Strategies today: increasing lean protein intake to established goals, decreasing simple carbohydrates , increasing vegetables, increasing fiber rich foods, increasing water intake , work on meal planning and preparation, work on tracking and journaling calories using tracking application, and continue to work on maintaining a reduced calorie state, getting the recommended amount of protein, incorporating whole foods, making healthy choices, staying well hydrated and practicing mindfulness when eating..  Additional resources provided today: NA  Recommended Physical Activity Goals  Steve Rojas has been advised to work up to 150 minutes of moderate intensity aerobic activity a week and strengthening exercises 2-3 times per week for cardiovascular health,  weight loss maintenance and preservation of muscle mass.  He has agreed to start exercising again.     ASSOCIATED CONDITIONS ADDRESSED TODAY  Action/Plan  Type 2 diabetes mellitus without complication, without long-term current use of insulin  (HCC) -     Tirzepatide; Inject 7.5 mg into the skin once a week.  Dispense: 2 mL; Refill: 0  Vitamin D  deficiency Continue Vit D as directed  Class 3 severe obesity due to excess calories with body mass index (BMI) of 45.0 to 49.9 in adult      Goals: Come every 2-3 weeks Start tracking To go back to the gym Bring wife to an appt   Return in about 3 weeks (around 04/19/2024).Aaron Aas He was informed of the importance of frequent follow up visits to maximize his success with intensive lifestyle modifications for his multiple health conditions.   ATTESTASTION STATEMENTS:  Reviewed by clinician on day of visit: allergies, medications, problem list, medical history, surgical history, family history, social history, and previous encounter notes.   Time spent on visit including pre-visit chart review and post-visit care and charting was 30 minutes.    Crist Dominion. Shawntina Diffee FNP-C

## 2024-04-09 DIAGNOSIS — E119 Type 2 diabetes mellitus without complications: Secondary | ICD-10-CM | POA: Diagnosis not present

## 2024-04-18 ENCOUNTER — Encounter: Payer: Self-pay | Admitting: Nurse Practitioner

## 2024-04-18 ENCOUNTER — Ambulatory Visit: Admitting: Nurse Practitioner

## 2024-04-18 VITALS — BP 122/74 | HR 86 | Ht >= 80 in | Wt >= 6400 oz

## 2024-04-18 DIAGNOSIS — Z7985 Long-term (current) use of injectable non-insulin antidiabetic drugs: Secondary | ICD-10-CM

## 2024-04-18 DIAGNOSIS — E66813 Obesity, class 3: Secondary | ICD-10-CM | POA: Diagnosis not present

## 2024-04-18 DIAGNOSIS — Z6841 Body Mass Index (BMI) 40.0 and over, adult: Secondary | ICD-10-CM | POA: Diagnosis not present

## 2024-04-18 DIAGNOSIS — E119 Type 2 diabetes mellitus without complications: Secondary | ICD-10-CM

## 2024-04-18 MED ORDER — TIRZEPATIDE 7.5 MG/0.5ML ~~LOC~~ SOAJ: 7.5000 mg | SUBCUTANEOUS | 0 refills | Status: DC

## 2024-04-18 NOTE — Progress Notes (Signed)
 Office: [phone]  /  Fax: [phone]  WEIGHT SUMMARY AND BIOMETRICS  Weight Lost Since Last Visit: 7lb  Weight Gained Since Last Visit: 0lb   Vitals BP: 122/74 Pulse Rate: 86 SpO2: 96 %   Anthropometric Measurements Height: 6\' 8"  (2.032 m) Weight: (!) 438 lb (198.7 kg) BMI (Calculated): 48.12 Weight at Last Visit: 445lb Weight Lost Since Last Visit: 7lb Weight Gained Since Last Visit: 0lb Starting Weight: 443lb Total Weight Loss (lbs): 0 lb (0 kg)   Body Composition  Body Fat %: 44.8 % Fat Mass (lbs): 196.4 lbs Muscle Mass (lbs): 230.6 lbs Total Body Water (lbs): 184.2 lbs Visceral Fat Rating : 33   Other Clinical Data Fasting: No Labs: No Today's Visit #: 15 Starting Date: 02/18/23     HPI  Chief Complaint: OBESITY  Steve Rojas is here to discuss his progress with his obesity treatment plan. He is on the keeping a food journal and adhering to recommended goals of 2000 calories and 120 protein and states he is following his eating plan approximately 80 % of the time. He states he is exercising 60 minutes 3 days per week.   Interval History:  Since last office visit he has lost 7 pounds.  He has been logging his food intake.  He is averaging around 1457-2289 calories, 66-168 carbs and 58-158 grams of protein.  He is exercising 3 days per week and has been working on his house to stay active.   Previous pharmacotherapy for medical weight loss:  none   Bariatric surgery:  Patient has not had bariatric surgery.   Pharmacotherapy for DMT2:   He is currently taking Mounjaro  7.5mg  dose, Actos 30mg , Amaryl 2mg  and Metformin  XR 500mg  1 tablet am and 2 pm.  Denies side effects.  Last A1c was 7.1                                                                                                                                          CBGs: 117 average Episodes of hypoglycemia: None Taking ASA 81mg  and statin.  Last eye exam:  2024-needs to sched He has tried  Ozempic  in the past.      No results found for: "HGBA1C" Lab Results  Component Value Date   LDLCALC 81 07/22/2023   CREATININE 0.89 07/22/2023      PHYSICAL EXAM:  Blood pressure 122/74, pulse 86, height 6\' 8"  (2.032 m), weight (!) 438 lb (198.7 kg), SpO2 96%. Body mass index is 48.12 kg/m.  General: He is overweight, cooperative, alert, well developed, and in no acute distress. PSYCH: Has normal mood, affect and thought process.   Extremities: No edema.  Neurologic: No gross sensory or motor deficits. No tremors or fasciculations noted.    DIAGNOSTIC DATA REVIEWED:  BMET    Component Value Date/Time   NA 138 07/22/2023 0933   K 4.4 07/22/2023 0933  CL 100 07/22/2023 0933   CO2 22 07/22/2023 0933   GLUCOSE 114 (H) 07/22/2023 0933   BUN 14 07/22/2023 0933   CREATININE 0.89 07/22/2023 0933   CALCIUM 9.8 07/22/2023 0933   No results found for: "HGBA1C" Lab Results  Component Value Date   INSULIN  9.4 02/18/2023   Lab Results  Component Value Date   TSH 3.200 07/22/2023   CBC    Component Value Date/Time   WBC 6.0 07/22/2023 0933   RBC 5.29 07/22/2023 0933   HGB 15.4 07/22/2023 0933   HCT 45.3 07/22/2023 0933   PLT 287 07/22/2023 0933   MCV 86 07/22/2023 0933   MCH 29.1 07/22/2023 0933   MCHC 34.0 07/22/2023 0933   RDW 13.3 07/22/2023 0933   Iron Studies No results found for: "IRON", "TIBC", "FERRITIN", "IRONPCTSAT" Lipid Panel     Component Value Date/Time   CHOL 142 07/22/2023 0933   TRIG 82 07/22/2023 0933   HDL 45 07/22/2023 0933   LDLCALC 81 07/22/2023 0933   Hepatic Function Panel     Component Value Date/Time   PROT 6.9 07/22/2023 0933   ALBUMIN 4.3 07/22/2023 0933   AST 13 07/22/2023 0933   ALT 21 07/22/2023 0933   ALKPHOS 102 07/22/2023 0933   BILITOT 0.9 07/22/2023 0933      Component Value Date/Time   TSH 3.200 07/22/2023 0933   Nutritional Lab Results  Component Value Date   VD25OH 43.2 07/22/2023   VD25OH 22.5 (L)  02/18/2023     ASSESSMENT AND PLAN  TREATMENT PLAN FOR OBESITY:  Recommended Dietary Goals  Lawsen is currently in the action stage of change. As such, his goal is to continue weight management plan. He has agreed to continue tracking and will review at next visit.    Behavioral Intervention  We discussed the following Behavioral Modification Strategies today: increasing lean protein intake to established goals, decreasing simple carbohydrates , increasing vegetables, increasing lower glycemic fruits, increasing fiber rich foods, increasing water intake , and continue to work on maintaining a reduced calorie state, getting the recommended amount of protein, incorporating whole foods, making healthy choices, staying well hydrated and practicing mindfulness when eating..  Additional resources provided today: NA  Recommended Physical Activity Goals  Nechemia has been advised to work up to 150 minutes of moderate intensity aerobic activity a week and strengthening exercises 2-3 times per week for cardiovascular health, weight loss maintenance and preservation of muscle mass.   He has agreed to Continue current level of physical activity , Think about enjoyable ways to increase daily physical activity and overcoming barriers to exercise, and Increase physical activity in their day and reduce sedentary time (increase NEAT).    ASSOCIATED CONDITIONS ADDRESSED TODAY  Action/Plan  Type 2 diabetes mellitus without complication, without long-term current use of insulin  (HCC) -     Continue Tirzepatide ; Inject 7.5 mg into the skin once a week.  Dispense: 2 mL; Refill: 0.  Side effect discussed.   Class 3 severe obesity due to excess calories with body mass index (BMI) of 45.0 to 49.9 in adult     Will obtain labs in Sept  BP looks better today   Return in about 2 weeks (around 05/02/2024).Aaron Aas He was informed of the importance of frequent follow up visits to maximize his success with intensive  lifestyle modifications for his multiple health conditions.   ATTESTASTION STATEMENTS:  Reviewed by clinician on day of visit: allergies, medications, problem list, medical history, surgical history, family  history, social history, and previous encounter notes.     Crist Dominion. Janaisa Birkland FNP-C

## 2024-05-02 ENCOUNTER — Ambulatory Visit: Admitting: Nurse Practitioner

## 2024-05-02 ENCOUNTER — Encounter: Payer: Self-pay | Admitting: Nurse Practitioner

## 2024-05-02 VITALS — BP 133/85 | HR 75 | Temp 97.9°F | Ht >= 80 in | Wt >= 6400 oz

## 2024-05-02 DIAGNOSIS — R350 Frequency of micturition: Secondary | ICD-10-CM | POA: Diagnosis not present

## 2024-05-02 DIAGNOSIS — E119 Type 2 diabetes mellitus without complications: Secondary | ICD-10-CM | POA: Diagnosis not present

## 2024-05-02 DIAGNOSIS — E66813 Obesity, class 3: Secondary | ICD-10-CM | POA: Diagnosis not present

## 2024-05-02 DIAGNOSIS — R972 Elevated prostate specific antigen [PSA]: Secondary | ICD-10-CM | POA: Diagnosis not present

## 2024-05-02 DIAGNOSIS — Z7984 Long term (current) use of oral hypoglycemic drugs: Secondary | ICD-10-CM

## 2024-05-02 DIAGNOSIS — Z7985 Long-term (current) use of injectable non-insulin antidiabetic drugs: Secondary | ICD-10-CM

## 2024-05-02 DIAGNOSIS — Z6841 Body Mass Index (BMI) 40.0 and over, adult: Secondary | ICD-10-CM

## 2024-05-02 NOTE — Progress Notes (Signed)
 Office: [phone]  /  Fax: [phone]  WEIGHT SUMMARY AND BIOMETRICS  Weight Lost Since Last Visit: 0lb  Weight Gained Since Last Visit: 1lb   Vitals Temp: 97.9 F (36.6 C) BP: 133/85 Pulse Rate: 75 SpO2: 95 %   Anthropometric Measurements Height: 6' 8 (2.032 m) Weight: (!) 439 lb (199.1 kg) BMI (Calculated): 48.23 Weight at Last Visit: 438lb Weight Lost Since Last Visit: 0lb Weight Gained Since Last Visit: 1lb Starting Weight: 443lb Total Weight Loss (lbs): 4 lb (1.814 kg)   Body Composition  Body Fat %: 45.4 % Fat Mass (lbs): 199.4 lbs Muscle Mass (lbs): 228.2 lbs Total Body Water (lbs): 183.4 lbs Visceral Fat Rating : 33   Other Clinical Data Fasting: No Labs: No Today's Visit #: 16 Starting Date: 02/18/23     HPI  Chief Complaint: OBESITY  Steve Rojas is here to discuss his progress with his obesity treatment plan. He is on the keeping a food journal and adhering to recommended goals of 2000 calories and 120 protein and states he is following his eating plan approximately 100 % of the time. He states he is exercising 0 minutes 0 days per week.   Interval History:  Since last office visit he has gained 1 pound. He is logging his meals and is averaging around [phone] calories, 31-162 protein and 36-242 carbs.  He hosted a meal at his house and has been eating left overs for several days.  Has been eating more chips.  He has been struggling with back pain for the past 2 weeks and hasn't been able to exercise.    Previous pharmacotherapy for medical weight loss:  none   Bariatric surgery:  Patient has not had bariatric surgery.   Pharmacotherapy for DMT2:  He is currently taking Mounjaro  7.5mg  dose, Actos 30mg , Amaryl 2mg  and Metformin  XR 500mg  1 tablet am and 2 pm.  Denies side effects.  Last A1c was 7.1                                                                                                                                          CBGs:  87-150 Episodes of hypoglycemia: None Taking ASA 81mg  and statin.  Last eye exam:  2024-needs to sched He has tried Ozempic  in the past.     Notes urinary frequency for the past 1-2 years.  Has seen PCP and has been taking tamsulosin.  Felt was due to DM. Has decreased his water intake in the evening. He's waking up 2-3 times per night to void.  Last PSA was elevated at 5.3 on 09/29/22. Has discussed with PCP.    No results found for: HGBA1C Lab Results  Component Value Date   LDLCALC 81 07/22/2023   CREATININE 0.89 07/22/2023     PHYSICAL EXAM:  Blood pressure 133/85, pulse 75, temperature 97.9 F (36.6 C), height 6' 8 (2.032 m), weight ROLLEN)  439 lb (199.1 kg), SpO2 95%. Body mass index is 48.23 kg/m.  General: He is overweight, cooperative, alert, well developed, and in no acute distress. PSYCH: Has normal mood, affect and thought process.   Extremities: No edema.  Neurologic: No gross sensory or motor deficits. No tremors or fasciculations noted.    DIAGNOSTIC DATA REVIEWED:  BMET    Component Value Date/Time   NA 138 07/22/2023 0933   K 4.4 07/22/2023 0933   CL 100 07/22/2023 0933   CO2 22 07/22/2023 0933   GLUCOSE 114 (H) 07/22/2023 0933   BUN 14 07/22/2023 0933   CREATININE 0.89 07/22/2023 0933   CALCIUM 9.8 07/22/2023 0933   No results found for: HGBA1C Lab Results  Component Value Date   INSULIN  9.4 02/18/2023   Lab Results  Component Value Date   TSH 3.200 07/22/2023   CBC    Component Value Date/Time   WBC 6.0 07/22/2023 0933   RBC 5.29 07/22/2023 0933   HGB 15.4 07/22/2023 0933   HCT 45.3 07/22/2023 0933   PLT 287 07/22/2023 0933   MCV 86 07/22/2023 0933   MCH 29.1 07/22/2023 0933   MCHC 34.0 07/22/2023 0933   RDW 13.3 07/22/2023 0933   Iron Studies No results found for: IRON, TIBC, FERRITIN, IRONPCTSAT Lipid Panel     Component Value Date/Time   CHOL 142 07/22/2023 0933   TRIG 82 07/22/2023 0933   HDL 45 07/22/2023 0933    LDLCALC 81 07/22/2023 0933   Hepatic Function Panel     Component Value Date/Time   PROT 6.9 07/22/2023 0933   ALBUMIN 4.3 07/22/2023 0933   AST 13 07/22/2023 0933   ALT 21 07/22/2023 0933   ALKPHOS 102 07/22/2023 0933   BILITOT 0.9 07/22/2023 0933      Component Value Date/Time   TSH 3.200 07/22/2023 0933   Nutritional Lab Results  Component Value Date   VD25OH 43.2 07/22/2023   VD25OH 22.5 (L) 02/18/2023     ASSESSMENT AND PLAN  TREATMENT PLAN FOR OBESITY:  Recommended Dietary Goals  Gerrett is currently in the action stage of change. As such, his goal is to continue weight management plan. He has agreed to track and will review macros at next visit.  Needs to work on being more consistent with calories, protein and carbs intake.  He's eating more carbs and leftovers.  Work on [employer].  Would benefit from seeing a RD.  Will discuss at next visit.      Behavioral Intervention  We discussed the following Behavioral Modification Strategies today: increasing lean protein intake to established goals, decreasing simple carbohydrates , increasing vegetables, increasing lower glycemic fruits, increasing water intake , work on meal planning and preparation, work on tracking and journaling calories using tracking application, and continue to work on maintaining a reduced calorie state, getting the recommended amount of protein, incorporating whole foods, making healthy choices, staying well hydrated and practicing mindfulness when eating..  Additional resources provided today: NA  Recommended Physical Activity Goals  Neev has been advised to work up to 150 minutes of moderate intensity aerobic activity a week and strengthening exercises 2-3 times per week for cardiovascular health, weight loss maintenance and preservation of muscle mass.   He has agreed to Think about enjoyable ways to increase daily physical activity and overcoming barriers to exercise, Increase physical  activity in their day and reduce sedentary time (increase NEAT)., and Work on scheduling and tracking physical activity.    ASSOCIATED CONDITIONS ADDRESSED  TODAY  Action/Plan  Type 2 diabetes mellitus without complication, without long-term current use of insulin  (HCC) Continue meds as directed  Good blood sugar control is important to decrease the likelihood of diabetic complications such as nephropathy, neuropathy, limb loss, blindness, coronary artery disease, and death. Intensive lifestyle modification including diet, exercise and weight loss are the first line of treatment for diabetes.    Would benefit from seeing a RD-will discuss at next visit.   Urinary frequency -     Ambulatory referral to Urology  Elevated PSA -     Ambulatory referral to Urology  Class 3 severe obesity due to excess calories with body mass index (BMI) of 45.0 to 49.9 in adult     Offered to obtain labs today.  Patient requested to wait to recheck labs in August or Sept.   To discuss bariatric surgery and referral to RD with patient at next visit.     Return in about 4 weeks (around 05/30/2024).SABRA He was informed of the importance of frequent follow up visits to maximize his success with intensive lifestyle modifications for his multiple health conditions.   ATTESTASTION STATEMENTS:  Reviewed by clinician on day of visit: allergies, medications, problem list, medical history, surgical history, family history, social history, and previous encounter notes.   Time spent on visit including pre-visit chart review and post-visit care and charting was 30 minutes.    Corean SAUNDERS. Vendetta Pittinger FNP-C

## 2024-05-09 DIAGNOSIS — E119 Type 2 diabetes mellitus without complications: Secondary | ICD-10-CM | POA: Diagnosis not present

## 2024-05-16 ENCOUNTER — Ambulatory Visit: Admitting: Nurse Practitioner

## 2024-05-16 ENCOUNTER — Encounter: Payer: Self-pay | Admitting: Nurse Practitioner

## 2024-05-16 VITALS — BP 136/79 | HR 67 | Temp 97.7°F | Ht >= 80 in | Wt >= 6400 oz

## 2024-05-16 DIAGNOSIS — Z7985 Long-term (current) use of injectable non-insulin antidiabetic drugs: Secondary | ICD-10-CM | POA: Diagnosis not present

## 2024-05-16 DIAGNOSIS — E66813 Obesity, class 3: Secondary | ICD-10-CM

## 2024-05-16 DIAGNOSIS — Z7984 Long term (current) use of oral hypoglycemic drugs: Secondary | ICD-10-CM

## 2024-05-16 DIAGNOSIS — Z6841 Body Mass Index (BMI) 40.0 and over, adult: Secondary | ICD-10-CM

## 2024-05-16 DIAGNOSIS — E119 Type 2 diabetes mellitus without complications: Secondary | ICD-10-CM

## 2024-05-16 MED ORDER — TIRZEPATIDE 10 MG/0.5ML ~~LOC~~ SOAJ: 10.0000 mg | SUBCUTANEOUS | 0 refills | Status: DC

## 2024-05-16 NOTE — Progress Notes (Signed)
 Office: [phone]  /  Fax: [phone]  WEIGHT SUMMARY AND BIOMETRICS  Weight Lost Since Last Visit: 1lb  Weight Gained Since Last Visit: 0lb   Vitals Temp: 97.7 F (36.5 C) BP: 136/79 Pulse Rate: 67 SpO2: 96 %   Anthropometric Measurements Height: 6' 8 (2.032 m) Weight: (!) 438 lb (198.7 kg) BMI (Calculated): 48.12 Weight at Last Visit: 439lb Weight Lost Since Last Visit: 1lb Weight Gained Since Last Visit: 0lb Starting Weight: 443lb Total Weight Loss (lbs): 5 lb (2.268 kg)   Body Composition  Body Fat %: 45.4 % Fat Mass (lbs): 198.8 lbs Muscle Mass (lbs): 227.6 lbs Total Body Water (lbs): 183.2 lbs Visceral Fat Rating : 33   Other Clinical Data Fasting: Yes Labs: No Today's Visit #: 17 Starting Date: 02/18/23     HPI  Chief Complaint: OBESITY  Benjamine is here to discuss his progress with his obesity treatment plan. He is on the keeping a food journal and adhering to recommended goals of 2000 calories and 120 protein and states he is following his eating plan approximately 90 % of the time. He states he is exercising 60 minutes 2-3 days per week.   Interval History:  Since last office visit he has lost 1 pound. He has been struggling with eating the wrong foods for 1-2 weeks over July 4th.  He is logging his meals and is averaging around 1024-2700 calories, 81-154 protein and 77-208 carbs. He's focusing on making lunch his big meal.  He is drinking water with flavoring, tea with splenda and diet coke.  He started exercising on 05/05/24-bike, walking and resistance training.  He re injured his back/hip yesterday.    Previous pharmacotherapy for medical weight loss:  none   Bariatric surgery:  Patient has not had bariatric surgery.   Pharmacotherapy for DMT2:   He is currently taking Mounjaro  7.5mg  dose, Actos 30mg , Amaryl 2mg  and Metformin  XR 500mg  1 tablet am and 2 pm.  Denies side effects.  Last A1c was 7.1 on 03/28/24                                                                                                                                          CBGs: 103-123 Episodes of hypoglycemia: denies Taking ASA 81mg  and statin.  Last eye exam:  2024-needs to sched-discussed againtoday to schedule  He has tried Ozempic  in the past.     -Notes urinary frequency for the past 1-2 years.  Has seen PCP and has been taking tamsulosin.  Felt was due to DM. Has decreased his water intake in the evening. He's waking up 2-3 times per night to void.  Last PSA was elevated at 5.3 on 09/29/22. Has discussed with PCP-I referred him to urology after his last visit and he has appointment with urology on 05/31/2024-  No results found for: HGBA1C Lab Results  Component Value Date  LDLCALC 81 07/22/2023   CREATININE 0.89 07/22/2023      PHYSICAL EXAM:  Blood pressure 136/79, pulse 67, temperature 97.7 F (36.5 C), height 6' 8 (2.032 m), weight (!) 438 lb (198.7 kg), SpO2 96%. Body mass index is 48.12 kg/m.  General: He is overweight, cooperative, alert, well developed, and in no acute distress. PSYCH: Has normal mood, affect and thought process.   Extremities: No edema.  Neurologic: No gross sensory or motor deficits. No tremors or fasciculations noted.    DIAGNOSTIC DATA REVIEWED:  BMET    Component Value Date/Time   NA 138 07/22/2023 0933   K 4.4 07/22/2023 0933   CL 100 07/22/2023 0933   CO2 22 07/22/2023 0933   GLUCOSE 114 (H) 07/22/2023 0933   BUN 14 07/22/2023 0933   CREATININE 0.89 07/22/2023 0933   CALCIUM 9.8 07/22/2023 0933   No results found for: HGBA1C Lab Results  Component Value Date   INSULIN  9.4 02/18/2023   Lab Results  Component Value Date   TSH 3.200 07/22/2023   CBC    Component Value Date/Time   WBC 6.0 07/22/2023 0933   RBC 5.29 07/22/2023 0933   HGB 15.4 07/22/2023 0933   HCT 45.3 07/22/2023 0933   PLT 287 07/22/2023 0933   MCV 86 07/22/2023 0933   MCH 29.1 07/22/2023 0933   MCHC 34.0  07/22/2023 0933   RDW 13.3 07/22/2023 0933   Iron Studies No results found for: IRON, TIBC, FERRITIN, IRONPCTSAT Lipid Panel     Component Value Date/Time   CHOL 142 07/22/2023 0933   TRIG 82 07/22/2023 0933   HDL 45 07/22/2023 0933   LDLCALC 81 07/22/2023 0933   Hepatic Function Panel     Component Value Date/Time   PROT 6.9 07/22/2023 0933   ALBUMIN 4.3 07/22/2023 0933   AST 13 07/22/2023 0933   ALT 21 07/22/2023 0933   ALKPHOS 102 07/22/2023 0933   BILITOT 0.9 07/22/2023 0933      Component Value Date/Time   TSH 3.200 07/22/2023 0933   Nutritional Lab Results  Component Value Date   VD25OH 43.2 07/22/2023   VD25OH 22.5 (L) 02/18/2023     ASSESSMENT AND PLAN  TREATMENT PLAN FOR OBESITY:  Recommended Dietary Goals  Tadashi is currently in the action stage of change. As such, his goal is to continue weight management plan. He has agreed to continue tracking and will review his calories and macros at his next visit.  Behavioral Intervention  We discussed the following Behavioral Modification Strategies today: increasing lean protein intake to established goals, increasing vegetables, increasing fiber rich foods, increasing water intake , work on tracking and journaling calories using tracking application, reading food labels , keeping healthy foods at home, and continue to work on maintaining a reduced calorie state, getting the recommended amount of protein, incorporating whole foods, making healthy choices, staying well hydrated and practicing mindfulness when eating..  Additional resources provided today: NA  Recommended Physical Activity Goals  Sladen has been advised to work up to 150 minutes of moderate intensity aerobic activity a week and strengthening exercises 2-3 times per week for cardiovascular health, weight loss maintenance and preservation of muscle mass.   He has agreed to Think about enjoyable ways to increase daily physical activity and  overcoming barriers to exercise, Increase physical activity in their day and reduce sedentary time (increase NEAT)., and Work on scheduling and tracking physical activity.    ASSOCIATED CONDITIONS ADDRESSED TODAY  Action/Plan  Type 2  diabetes mellitus without complication, without long-term current use of insulin  (HCC) -     Amb ref to Medical Nutrition Therapy-MNT -     Tirzepatide ; Inject 10 mg into the skin once a week.  Dispense: 2 mL; Refill: 0  Class 3 severe obesity due to excess calories with body mass index (BMI) of 45.0 to 49.9 in adult     Will obtain labs in August or September   We did not discuss the possibility of bariatric surgery.  Patient was worried about his upcoming appointment with urology.  I have asked him to keep his appointment with urology.   Return in about 4 weeks (around 06/13/2024).SABRA He was informed of the importance of frequent follow up visits to maximize his success with intensive lifestyle modifications for his multiple health conditions.   ATTESTASTION STATEMENTS:  Reviewed by clinician on day of visit: allergies, medications, problem list, medical history, surgical history, family history, social history, and previous encounter notes.    Corean SAUNDERS. Tyria Springer FNP-C

## 2024-05-31 ENCOUNTER — Other Ambulatory Visit
Admission: RE | Admit: 2024-05-31 | Discharge: 2024-05-31 | Disposition: A | Discharge status: Home / Self Care | Attending: Urology | Admitting: Urology

## 2024-05-31 ENCOUNTER — Ambulatory Visit: Admitting: Urology

## 2024-05-31 ENCOUNTER — Encounter: Payer: Self-pay | Admitting: Nurse Practitioner

## 2024-05-31 VITALS — BP 164/82 | HR 86 | Ht >= 80 in | Wt >= 6400 oz

## 2024-05-31 DIAGNOSIS — N529 Male erectile dysfunction, unspecified: Secondary | ICD-10-CM

## 2024-05-31 DIAGNOSIS — E291 Testicular hypofunction: Secondary | ICD-10-CM | POA: Insufficient documentation

## 2024-05-31 DIAGNOSIS — R972 Elevated prostate specific antigen [PSA]: Secondary | ICD-10-CM | POA: Diagnosis not present

## 2024-05-31 DIAGNOSIS — R399 Unspecified symptoms and signs involving the genitourinary system: Secondary | ICD-10-CM

## 2024-05-31 MED ORDER — SILDENAFIL CITRATE 100 MG PO TABS: 100.0000 mg | ORAL_TABLET | Freq: Every day | ORAL | 6 refills | Status: AC | PRN

## 2024-05-31 NOTE — Progress Notes (Signed)
   05/31/24 9:10 AM   Steve Rojas 05/21/1961 [id number]  CC: Urinary symptoms, nocturia, ED, elevated PSA  HPI: 63 year old male with a number of medical issues including morbid obesity with a BMI of 47, DM2 with hemoglobin A1C of 7, elevated PSA 5.3 from November 2023, and urinary symptoms with postvoid dribbling and nocturia 3-4 times overnight.  He denies any gross hematuria or dysuria.  Urinalysis with PCP was benign.  PSA was 5.3 from November 2023 and I do not see this was ever further evaluated.  Only prior PSA value was 2.6 in 2021.  He has had problems with erections despite Cialis 20 mg as needed, interested in other options.  He is interested in having a testosterone  checked.  Takes Flomax long-term which he feels may be mildly helpful.  He has tried to cut back on diet sodas but still has diet sweet tea.  Has never been evaluated for sleep apnea.   PMH: Past Medical History:  Diagnosis Date   Back pain    Chest pain    Diabetes (HCC)    Hyperlipidemia    Hypochloremia    Joint pain    Vitamin B12 deficiency     Surgical History: Past Surgical History:  Procedure Laterality Date   MEDIAL PARTIAL KNEE REPLACEMENT Right 01/2014    Family History: Family History  Problem Relation Age of Onset   CAD Mother    High blood pressure Mother    Thyroid  disease Mother    Angina Father    High blood pressure Father    High Cholesterol Father    Heart disease Father    Sudden death Father     Social History:  reports that he quit smoking about 31 years ago. His smoking use included cigarettes. He has never used smokeless tobacco. He reports that he does not drink alcohol and does not use drugs.  Physical Exam: BP (!) 164/82 (BP Location: Left Arm, Patient Position: Sitting, Cuff Size: Large)   Pulse 86   Ht 6' 8 (2.032 m)   Wt (!) 427 lb (193.7 kg)   SpO2 94%   BMI 46.91 kg/m    Constitutional:  Alert and oriented, No acute distress. Cardiovascular: No  clubbing, cyanosis, or edema. Respiratory: Normal respiratory effort, no increased work of breathing. GI: Abdomen is soft, nontender, nondistended, no abdominal masses   Laboratory Data: Reviewed see HPI  Pertinent Imaging: None to review  Assessment & Plan:   63 year old male with a number of urologic issues including urinary symptoms with postvoid dribbling, weak stream, nocturia 3-5 times overnight, ED, and mildly elevated PSA of 5.3.  We discussed the concept of sleep apnea and that this may be responsible for most of his urologic issues.  We discussed other possible causes like BPH, overactive bladder, obesity, urethral stricture, prostate cancer.  I recommended avoiding bladder irritants and continuing the Flomax.  Nocturia strategies discussed.  AUA guidelines reviewed regarding PSA screening and the risks and benefits.  Will recheck PSA with reflex to free today.    Will check testosterone  today, trial of sildenafil  100 mg on demand  -Strongly recommended sleep apnea evaluation, he will reach out to his PCP to order -PSA reflex to free today -Testosterone  today -Behavioral strategies discussed regarding nocturia -Trial of sildenafil  100 mg on demand  Redell Burnet, MD 05/31/2024  [facility] [address], [address] [place], [place] [address] [phone]

## 2024-05-31 NOTE — Patient Instructions (Addendum)
 Nocturia refers to the need to wake up during the night to urinate, which can disrupt your sleep and impact your overall well-being. Fortunately, there are several strategies you can employ to help prevent or manage nocturia. It's important to consult with your healthcare provider before making any significant changes to your routine. Here are some helpful strategies to consider:  Sleep apnea is one of the most common causes of getting up overnight to urinate.  If you are overweight or snore, we recommend being evaluated for sleep apnea as this can be the primary cause of overnight urination, as well as other urologic issues like low testosterone  and ED.  Limit Fluid Intake Before Bed: Avoid drinking large amounts of fluids in the evening, especially within a few hours of bedtime. Consume most of your daily fluid intake earlier in the day to reduce the need to urinate at night.  Monitor Your Diet: Limit your intake of caffeine and alcohol, as these substances can increase urine production and irritate the bladder.  Avoid diet, zero calorie, and artificially sweetened drinks, especially sodas, in the afternoon or evening. Be mindful of consuming foods and drinks with high water content before bedtime, such as watermelon and herbal teas.  Time Your Medications: If you're taking medications that contribute to increased urination, consult your healthcare provider about adjusting the timing of these medications to minimize their impact during the night.  Practice Double Voiding: Before going to bed, make an effort to empty your bladder twice within a short period. This can help reduce the amount of urine left in your bladder before sleep.  Bladder Training: Gradually increase the time between bathroom visits during the day to train your bladder to hold larger volumes of urine. Over time, this can help reduce the frequency of nighttime awakenings to urinate.  Elevate Your Legs During the  Day: Elevating your legs during the day can help minimize fluid retention in your lower extremities, which might reduce nighttime urination.  Pelvic Floor Exercises: Strengthening your pelvic floor muscles through Kegel exercises can help improve bladder control and potentially reduce the urge to urinate at night.  Create a Relaxing Bedtime Routine: Stress and anxiety can exacerbate nocturia. Engage in calming activities before bed, such as reading, listening to soothing music, or practicing relaxation techniques.  Stay Active: Engage in regular physical activity, but avoid intense exercise close to bedtime, as this can increase your body's demand for fluids.  Maintain a Healthy Weight: Excess weight can compress the bladder and contribute to bladder and urinary issues. Aim to achieve and maintain a healthy weight through a balanced diet and regular exercise.  Remember that every individual is unique, and the effectiveness of these strategies may vary. It's important to work with your healthcare provider to develop a plan that suits your specific needs and addresses any underlying causes of nocturia.   Sleep Apnea  Sleep apnea is a condition that affects your breathing while you are sleeping. Your tongue or soft tissue in your throat may block the flow of air while you sleep. You may have shallow breathing or stop breathing for short periods of time. People with sleep apnea may snore loudly. There are three kinds of sleep apnea: Obstructive sleep apnea. This kind is caused by a blocked or collapsed airway. This is the most common. Central sleep apnea. This kind happens when the part of the brain that controls breathing does not send the correct signals to the muscles that control breathing. Mixed sleep apnea. This  is a combination of obstructive and central sleep apnea. What are the causes? The most common cause of sleep apnea is a collapsed or blocked airway. What increases the  risk? Being very overweight. Having family members with sleep apnea. Having a tongue or tonsils that are larger than normal. Having a small airway or jaw problems. Being older. What are the signs or symptoms? Loud snoring. Restless sleep. Trouble staying asleep. Being sleepy or tired during the day. Waking up gasping or choking. Having a headache in the morning. Mood swings. Having a hard time remembering things and concentrating. How is this diagnosed? A medical history. A physical exam. A sleep study. This is also called a polysomnography test. This test is done at a sleep lab or in your home while you are sleeping. How is this treated? Treatment may include: Sleeping on your side. Losing weight if you're overweight. Wearing an oral appliance. This is a mouthpiece that moves your lower jaw forward. Using a positive airway pressure (PAP) device to keep your airways open while you sleep, such as: A continuous positive airway pressure (CPAP) device. This device gives forced air through a mask when you breathe out. This keeps your airways open. A bilevel positive airway pressure (BIPAP) device. This device gives forced air through a mask when you breathe in and when you breathe out to keep your airways open. Having surgery if other treatments do not work. If your sleep apnea is not treated, you may be at risk for: Heart failure. Heart attack. Stroke. Type 2 diabetes or a problem with your blood sugar called insulin  resistance. Follow these instructions at home: Medicines Take your medicines only as told by your health care provider. Avoid alcohol, medicines to help you relax, and certain pain medicines. These may make sleep apnea worse. General instructions Do not smoke, vape, or use products with nicotine or tobacco in them. If you need help quitting, talk with your provider. If you were given a PAP device to open your airway while you sleep, use it as told by your provider. If  you're having surgery, make sure to tell your provider you have sleep apnea. You may need to bring your PAP device with you. Contact a health care provider if: The PAP device that you were given to use during sleep bothers you or does not seem to be working. You do not feel better or you feel worse. Get help right away if: You have trouble breathing. You have chest pain. You have trouble talking. One side of your body feels weak. A part of your face is hanging down. These symptoms may be an emergency. Call 911 right away. Do not wait to see if the symptoms will go away. Do not drive yourself to the hospital. This information is not intended to replace advice given to you by your health care provider. Make sure you discuss any questions you have with your health care provider. Document Revised: 07/29/2023 Document Reviewed: 12/31/2022 Elsevier Patient Education  2024 [employer].

## 2024-06-01 ENCOUNTER — Other Ambulatory Visit: Payer: Self-pay | Admitting: Nurse Practitioner

## 2024-06-01 DIAGNOSIS — E66813 Obesity, class 3: Secondary | ICD-10-CM

## 2024-06-01 LAB — PSA, TOTAL AND FREE
PSA, Free Pct: 19.4 %
PSA, Free: 0.99 ng/mL
Prostate Specific Ag, Serum: 5.1 ng/mL — ABNORMAL HIGH (ref 0.0–4.0)

## 2024-06-01 LAB — TESTOSTERONE: Testosterone: 425 ng/dL (ref 264–916)

## 2024-06-02 ENCOUNTER — Ambulatory Visit: Payer: Self-pay | Admitting: Urology

## 2024-06-02 DIAGNOSIS — R972 Elevated prostate specific antigen [PSA]: Secondary | ICD-10-CM

## 2024-06-02 NOTE — Telephone Encounter (Signed)
 6 month follow up scheduled, pt aware.

## 2024-06-09 DIAGNOSIS — E119 Type 2 diabetes mellitus without complications: Secondary | ICD-10-CM | POA: Diagnosis not present

## 2024-06-20 ENCOUNTER — Ambulatory Visit: Admitting: Nurse Practitioner

## 2024-06-20 ENCOUNTER — Encounter: Payer: Self-pay | Admitting: Nurse Practitioner

## 2024-06-20 VITALS — BP 126/72 | HR 85 | Temp 97.1°F | Ht >= 80 in | Wt >= 6400 oz

## 2024-06-20 DIAGNOSIS — G4710 Hypersomnia, unspecified: Secondary | ICD-10-CM

## 2024-06-20 DIAGNOSIS — R519 Headache, unspecified: Secondary | ICD-10-CM

## 2024-06-20 DIAGNOSIS — R0683 Snoring: Secondary | ICD-10-CM

## 2024-06-20 DIAGNOSIS — G4719 Other hypersomnia: Secondary | ICD-10-CM | POA: Insufficient documentation

## 2024-06-20 DIAGNOSIS — R351 Nocturia: Secondary | ICD-10-CM | POA: Diagnosis not present

## 2024-06-20 DIAGNOSIS — E119 Type 2 diabetes mellitus without complications: Secondary | ICD-10-CM

## 2024-06-20 DIAGNOSIS — Z7984 Long term (current) use of oral hypoglycemic drugs: Secondary | ICD-10-CM

## 2024-06-20 DIAGNOSIS — Z6841 Body Mass Index (BMI) 40.0 and over, adult: Secondary | ICD-10-CM

## 2024-06-20 NOTE — Progress Notes (Signed)
 @Patient  ID: Steve Rojas, male    DOB: 22-Aug-1961, 63 y.o.   MRN: [id number]  Chief Complaint  Patient presents with   Consult    No previous sleep study. Restless sleep. Snoring.     Referring provider: Becki Krabbe, *  HPI: 63 year old male, former smoker referred for sleep consult.  Past medical history significant for HLD, DM 2.  TEST/EVENTS:   06/20/2024: Today - sleep consult Discussed the use of AI scribe software for clinical note transcription with the patient, who gave verbal consent to proceed.  History of Present Illness Steve Rojas is a 63 year old male presents with frequent nighttime urination and suspected sleep apnea.  He experiences frequent nighttime urination, which prompted a consultation with a urologist. The urologist recommended a sleep study due to symptoms of restless sleep, fatigue, and snoring. He is participating in a wellness program to lose weight, and they also encouraged he have a sleep study based on his symptoms.  He has a history of type 2 diabetes and initially attributed his fatigue to this condition. His energy levels have significantly decreased since turning 52, with lethargy and a lack of motivation to engage in physical activities. Previously active, he now goes to bed at 9 PM, wakes up multiple times during the night, and feels ready to sleep again by noon.  He quit smoking in the past, leading to a weight gain of approximately 100 pounds. He suspects untreated sleep apnea may have contributed to development of diabetes.  No waking up choking, gasping, or coughing, and no drowsy driving or sleepwalking. He occasionally wakes up with morning headaches, attributed to sinus pressure changes. His wife has mentioned that he stops breathing during sleep. No sleep paralysis, hx of narcolepsy or cataplexy.  He consumes minimal caffeine, primarily through Diet Coke and occasional tea, and has about one alcoholic beverage per month. 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 He lives with his wife.  Never had a prior sleep study. No O2 use.  Epworth 6    Allergies  Allergen Reactions   Penicillins Other (See Comments)    Had reaction as a child- unsure of what     Immunization History  Administered Date(s) Administered   Influenza, Mdck, Trivalent,PF 6+ MOS(egg free) 12/03/2023   Moderna Covid-19 Vaccine Bivalent Booster 57yrs & up 11/13/2021   Moderna Sars-Covid-2 Vaccination 10/22/2020    Past Medical History:  Diagnosis Date   Back pain    Chest pain    Diabetes (HCC)    Hyperlipidemia    Hypochloremia    Joint pain    Vitamin B12 deficiency     Tobacco History: Social History   Tobacco Use  Smoking Status Former   Current packs/day: 0.00   Types: Cigarettes   Quit date: 11/09/1992   Years since quitting: 31.6  Smokeless Tobacco Never   Counseling given: Not Answered   Outpatient Medications Prior to Visit  Medication Sig Dispense Refill   aspirin 81 MG tablet Take 81 mg by mouth daily.     atorvastatin (LIPITOR) 20 MG tablet Take 20 mg by mouth daily.     cetirizine -pseudoephedrine  (ZYRTEC -D) 5-120 MG tablet Take 1 tablet by mouth daily. 30 tablet 0   Cyanocobalamin  (B-12 PO) Take 1 tablet by mouth daily.     Docusate Sodium (DSS) 100 MG CAPS Take by mouth.     fluticasone  (FLONASE ) 50 MCG/ACT nasal spray Place  2 sprays into both nostrils daily. 16 g 0   glimepiride (AMARYL) 2 MG tablet Take 1 tablet by mouth daily.     glimepiride (AMARYL) 4 MG tablet Take 4 mg by mouth daily with breakfast.     ibuprofen  (ADVIL ,MOTRIN ) 600 MG tablet Take 1 tablet (600 mg total) by mouth 3 (three) times daily. (Patient taking differently: Take 600 mg by mouth every 8 (eight) hours as needed.) 30 tablet 0   metFORMIN  (GLUCOPHAGE -XR) 500 MG 24 hr tablet TAKE 3 TABLETS (1,500 MG TOTAL) BY MOUTH DAILY 270 tablet 0    pioglitazone (ACTOS) 30 MG tablet Take 30 mg by mouth daily.     sildenafil  (VIAGRA ) 100 MG tablet Take 1 tablet (100 mg total) by mouth daily as needed for erectile dysfunction (take 45 minutes prior to sexual activity). 30 tablet 6   tadalafil (CIALIS) 20 MG tablet Take 1 tablet by mouth daily as needed. As directed for ED     tamsulosin (FLOMAX) 0.4 MG CAPS capsule Take 0.4 mg by mouth daily.     tirzepatide  (MOUNJARO ) 10 MG/0.5ML Pen Inject 10 mg into the skin once a week. 2 mL 0   tirzepatide  (MOUNJARO ) 7.5 MG/0.5ML Pen Inject 7.5 mg into the skin once a week. 2 mL 0   Vitamin D , Ergocalciferol , (DRISDOL ) 1.25 MG (50000 UNIT) CAPS capsule Take 1 capsule (50,000 Units total) by mouth every 7 (seven) days. 5 capsule 0   No facility-administered medications prior to visit.     Review of Systems:   Constitutional: No recent weight loss or gain, night sweats +fatigue  HEENT: +occasional AM headaches CV:  No chest pain, orthopnea, PND, palpitations Resp: +snoring, witnessed apneas,   GU:+nocturia  Neuro: No memory impairment  Psych: No depression or anxiety. Mood stable. +sleep disturbance     Physical Exam:  BP 126/72 (BP Location: Right Arm, Cuff Size: Large)   Pulse 85   Temp (!) 97.1 F (36.2 C)   Ht 6' 8 (2.032 m)   Wt (!) 442 lb (200.5 kg)   SpO2 95%   BMI 48.56 kg/m   GEN: Pleasant, interactive, well-kempt; morbidly obese; in no acute distress HEENT:  Normocephalic and atraumatic. PERRLA. Sclera white. Nasal turbinates pink, moist and patent bilaterally. No rhinorrhea present. Oropharynx pink and moist, without exudate or edema. No lesions, ulcerations, or postnasal drip. Mallampati IV NECK:  Supple w/ fair ROM. No JVD present. Thyroid  symmetrical with no goiter or nodules palpated. No lymphadenopathy.   CV: RRR, no m/r/g, no peripheral edema. Pulses intact, +2 bilaterally.  PULMONARY:  Unlabored, regular breathing. Clear bilaterally A&P w/o wheezes/rales/rhonchi. No  accessory muscle use.  GI: BS present and normoactive. Soft, non-tender to palpation. No organomegaly or masses detected.  MSK: No erythema, warmth or tenderness. Cap refil <2 sec all extrem. Neuro: A/Ox3. No focal deficits noted.   Skin: Warm, no lesions or rashe Psych: Normal affect and behavior. Judgement and thought content appropriate.     Lab Results:  CBC    Component Value Date/Time   WBC 6.0 07/22/2023 0933   RBC 5.29 07/22/2023 0933   HGB 15.4 07/22/2023 0933   HCT 45.3 07/22/2023 0933   PLT 287 07/22/2023 0933   MCV 86 07/22/2023 0933   MCH 29.1 07/22/2023 0933   MCHC 34.0 07/22/2023 0933   RDW 13.3 07/22/2023 0933   LYMPHSABS 1.8 07/22/2023 0933   EOSABS 0.1 07/22/2023 0933   BASOSABS 0.1 07/22/2023 0933    BMET  Component Value Date/Time   NA 138 07/22/2023 0933   K 4.4 07/22/2023 0933   CL 100 07/22/2023 0933   CO2 22 07/22/2023 0933   GLUCOSE 114 (H) 07/22/2023 0933   BUN 14 07/22/2023 0933   CREATININE 0.89 07/22/2023 0933   CALCIUM 9.8 07/22/2023 0933    BNP No results found for: BNP   Imaging:  No results found.  Administration History     None           No data to display          No results found for: NITRICOXIDE      Assessment & Plan:   Excessive daytime sleepiness He has snoring, excessive daytime sleepiness, nocturnal apneic events, morning headaches, restless sleep, nocturia. BMI 48. History of DM. Given this,  I am concerned he could have sleep disordered breathing with obstructive sleep apnea. He will need sleep study for further evaluation. May need in lab study pending results; pt aware.    - discussed how weight can impact sleep and risk for sleep disordered breathing - discussed options to assist with weight loss: combination of diet modification, cardiovascular and strength training exercises   - had an extensive discussion regarding the adverse health consequences related to untreated sleep disordered  breathing - specifically discussed the risks for hypertension, coronary artery disease, cardiac dysrhythmias, cerebrovascular disease, and diabetes - lifestyle modification discussed   - discussed how sleep disruption can increase risk of accidents, particularly when driving - safe driving practices were discussed  Patient Instructions  Given your symptoms, I am concerned that you may have sleep disordered breathing with sleep apnea. You will need a sleep study for further evaluation. Someone will contact you to schedule this.   We discussed how untreated sleep apnea puts an individual at risk for cardiac arrhthymias, pulm HTN, DM, stroke and increases their risk for daytime accidents. We also briefly reviewed treatment options including weight loss, side sleeping position, oral appliance, CPAP therapy or referral to ENT for possible surgical options  Use caution when driving and pull over if you become sleepy.  Follow up in 6 weeks with Katie Welda Azzarello,NP to go over sleep study results, or sooner, if needed. Friday PM virtual clinic preferred       Loud snoring See above  Morbid obesity (HCC) BMI 48. Healthy weight loss encouraged. On Mounjaro  - following with medical weight management   Diabetes mellitus type 2, uncomplicated (HCC) Reviewed correlation between DM II and untreated OSA. Follow up with PCP as scheduled.    Advised if symptoms do not improve or worsen, to please contact office for sooner follow up or seek emergency care.   I spent 45 minutes of dedicated to the care of this patient on the date of this encounter to include pre-visit review of records, face-to-face time with the patient discussing conditions above, post visit ordering of testing, clinical documentation with the electronic health record, making appropriate referrals as documented, and communicating necessary findings to members of the patients care team.  Comer LULLA Rouleau, NP 06/20/2024  Pt aware and  understands NP's role.

## 2024-06-20 NOTE — Assessment & Plan Note (Signed)
 See above

## 2024-06-20 NOTE — Assessment & Plan Note (Addendum)
 BMI 48. Healthy weight loss encouraged. On Mounjaro  - following with medical weight management

## 2024-06-20 NOTE — Assessment & Plan Note (Signed)
 Reviewed correlation between DM II and untreated OSA. Follow up with PCP as scheduled.

## 2024-06-20 NOTE — Patient Instructions (Signed)

## 2024-06-20 NOTE — Assessment & Plan Note (Signed)
 He has snoring, excessive daytime sleepiness, nocturnal apneic events, morning headaches, restless sleep, nocturia. BMI 48. History of DM. Given this,  I am concerned he could have sleep disordered breathing with obstructive sleep apnea. He will need sleep study for further evaluation. May need in lab study pending results; pt aware.    - discussed how weight can impact sleep and risk for sleep disordered breathing - discussed options to assist with weight loss: combination of diet modification, cardiovascular and strength training exercises   - had an extensive discussion regarding the adverse health consequences related to untreated sleep disordered breathing - specifically discussed the risks for hypertension, coronary artery disease, cardiac dysrhythmias, cerebrovascular disease, and diabetes - lifestyle modification discussed   - discussed how sleep disruption can increase risk of accidents, particularly when driving - safe driving practices were discussed  Patient Instructions  Given your symptoms, I am concerned that you may have sleep disordered breathing with sleep apnea. You will need a sleep study for further evaluation. Someone will contact you to schedule this.   We discussed how untreated sleep apnea puts an individual at risk for cardiac arrhthymias, pulm HTN, DM, stroke and increases their risk for daytime accidents. We also briefly reviewed treatment options including weight loss, side sleeping position, oral appliance, CPAP therapy or referral to ENT for possible surgical options  Use caution when driving and pull over if you become sleepy.  Follow up in 6 weeks with Katie Beronica Lansdale,NP to go over sleep study results, or sooner, if needed. Friday PM virtual clinic preferred

## 2024-06-21 ENCOUNTER — Ambulatory Visit: Admitting: Nurse Practitioner

## 2024-06-21 ENCOUNTER — Encounter: Payer: Self-pay | Admitting: Nurse Practitioner

## 2024-06-21 VITALS — BP 138/87 | HR 84 | Temp 98.0°F | Ht >= 80 in | Wt >= 6400 oz

## 2024-06-21 DIAGNOSIS — E119 Type 2 diabetes mellitus without complications: Secondary | ICD-10-CM

## 2024-06-21 DIAGNOSIS — Z6841 Body Mass Index (BMI) 40.0 and over, adult: Secondary | ICD-10-CM

## 2024-06-21 DIAGNOSIS — E66813 Obesity, class 3: Secondary | ICD-10-CM | POA: Diagnosis not present

## 2024-06-21 DIAGNOSIS — Z7985 Long-term (current) use of injectable non-insulin antidiabetic drugs: Secondary | ICD-10-CM | POA: Diagnosis not present

## 2024-06-21 MED ORDER — TIRZEPATIDE 12.5 MG/0.5ML ~~LOC~~ SOAJ: 12.5000 mg | SUBCUTANEOUS | 0 refills | Status: DC

## 2024-06-21 NOTE — Progress Notes (Signed)
 Office: [phone]  /  Fax: [phone]  WEIGHT SUMMARY AND BIOMETRICS  Weight Lost Since Last Visit: 0lb  Weight Gained Since Last Visit: 1lb   Vitals Temp: 98 F (36.7 C) BP: 138/87 Pulse Rate: 84 SpO2: 96 %   Anthropometric Measurements Height: 6' 8 (2.032 m) Weight: (!) 439 lb (199.1 kg) BMI (Calculated): 48.23 Weight at Last Visit: 438lb Weight Lost Since Last Visit: 0lb Weight Gained Since Last Visit: 1lb Starting Weight: 443lb Total Weight Loss (lbs): 4 lb (1.814 kg)   Body Composition  Body Fat %: 45.4 % Fat Mass (lbs): 199.4 lbs Muscle Mass (lbs): 228.4 lbs Total Body Water (lbs): 184.2 lbs Visceral Fat Rating : 33   Other Clinical Data Fasting: Yes Labs: No Today's Visit #: 18 Starting Date: 02/18/23     HPI  Chief Complaint: OBESITY  Steve Rojas is here to discuss his progress with his obesity treatment plan. He is on the keeping a food journal and adhering to recommended goals of 2000 calories and 120 protein and states he is following his eating plan approximately 80 % of the time. He states he is exercising 0 minutes 0 days per week.   Interval History:  Since last office visit he has gained 1 pound. He saw pulmonary yesterday for eval for OSAS and has a follow up appt on 08/07/24. Plans to have HST.   He saw urology last on 05/31/24.   I had also referred him to [facility] and patient refused service.  He notes is struggling with meeting his goals. He wants to lose weight but has not motivation and is hopeful and excited to have a sleep study.  He doesn't ever feel rested and is always tired.  He is averaging around 1100-2200 calories, 51-121 grams of protein and 49-101 carbs.  He is drinking water daily.  Has been limiting diet sodas.    Previous pharmacotherapy for medical weight loss:  none   Bariatric surgery:  Patient has not had bariatric surgery.   Pharmacotherapy for DMT2:  He is currently taking Mounjaro  10 mg dose, Actos 30mg , Amaryl 2mg   and Metformin  XR 500mg  1 tablet am and 2 pm.  Occ side effects of nausea and constipation.   Last A1c was 7.1 on 03/28/24 CBGs: 76-156 Episodes of hypoglycemia: no Taking ASA 81mg  and statin.  Last eye exam:  scheduled for 07/06/24 He has tried Ozempic  in the past.    No results found for: HGBA1C Lab Results  Component Value Date   LDLCALC 81 07/22/2023   CREATININE 0.89 07/22/2023      PHYSICAL EXAM:  Blood pressure 138/87, pulse 84, temperature 98 F (36.7 C), height 6' 8 (2.032 m), weight (!) 439 lb (199.1 kg), SpO2 96%. Body mass index is 48.23 kg/m.  General: He is overweight, cooperative, alert, well developed, and in no acute distress. PSYCH: Has normal mood, affect and thought process.   Extremities: No edema.  Neurologic: No gross sensory or motor deficits. No tremors or fasciculations noted.    DIAGNOSTIC DATA REVIEWED:  BMET    Component Value Date/Time   NA 138 07/22/2023 0933   K 4.4 07/22/2023 0933   CL 100 07/22/2023 0933   CO2 22 07/22/2023 0933   GLUCOSE 114 (H) 07/22/2023 0933   BUN 14 07/22/2023 0933   CREATININE 0.89 07/22/2023 0933   CALCIUM 9.8 07/22/2023 0933   No results found for: HGBA1C Lab Results  Component Value Date   INSULIN  9.4 02/18/2023   Lab Results  Component Value Date   TSH 3.200 07/22/2023   CBC    Component Value Date/Time   WBC 6.0 07/22/2023 0933   RBC 5.29 07/22/2023 0933   HGB 15.4 07/22/2023 0933   HCT 45.3 07/22/2023 0933   PLT 287 07/22/2023 0933   MCV 86 07/22/2023 0933   MCH 29.1 07/22/2023 0933   MCHC 34.0 07/22/2023 0933   RDW 13.3 07/22/2023 0933   Iron Studies No results found for: IRON, TIBC, FERRITIN, IRONPCTSAT Lipid Panel     Component Value Date/Time   CHOL 142 07/22/2023 0933   TRIG 82 07/22/2023 0933   HDL 45 07/22/2023 0933   LDLCALC 81 07/22/2023 0933   Hepatic Function Panel     Component Value Date/Time   PROT 6.9 07/22/2023 0933   ALBUMIN 4.3 07/22/2023 0933   AST  13 07/22/2023 0933   ALT 21 07/22/2023 0933   ALKPHOS 102 07/22/2023 0933   BILITOT 0.9 07/22/2023 0933      Component Value Date/Time   TSH 3.200 07/22/2023 0933   Nutritional Lab Results  Component Value Date   VD25OH 43.2 07/22/2023   VD25OH 22.5 (L) 02/18/2023     ASSESSMENT AND PLAN  TREATMENT PLAN FOR OBESITY:  Recommended Dietary Goals  Steve Rojas is currently in the action stage of change. As such, his goal is to continue weight management plan. He has agreed to keeping a food journal and adhering to recommended goals of 2000 calories and 120+ grams of  protein.  Behavioral Intervention  We discussed the following Behavioral Modification Strategies today: increasing lean protein intake to established goals, decreasing simple carbohydrates , increasing vegetables, increasing fiber rich foods, avoiding skipping meals, increasing water intake , work on meal planning and preparation, work on tracking and journaling calories using tracking application, and continue to work on maintaining a reduced calorie state, getting the recommended amount of protein, incorporating whole foods, making healthy choices, staying well hydrated and practicing mindfulness when eating..  Additional resources provided today: NA  Recommended Physical Activity Goals  Steve Rojas has been advised to work up to 150 minutes of moderate intensity aerobic activity a week and strengthening exercises 2-3 times per week for cardiovascular health, weight loss maintenance and preservation of muscle mass.   He has agreed to Think about enjoyable ways to increase daily physical activity and overcoming barriers to exercise, Increase physical activity in their day and reduce sedentary time (increase NEAT)., and Work on scheduling and tracking physical activity.     ASSOCIATED CONDITIONS ADDRESSED TODAY  Action/Plan  Type 2 diabetes mellitus without complication, without long-term current use of insulin  (HCC) -      Tirzepatide ; Inject 12.5 mg into the skin once a week.  Dispense: 2 mL; Refill: 0 Stop Actos per patient's request. To send me blood sugars every couple of days.  Will adjust meds based up readings.  To monitor for hypoglycemia  Would benefit from seeing [facility]  Class 3 severe obesity due to excess calories with body mass index (BMI) of 45.0 to 49.9 in adult     Discussed referral to see a therapist due to anxiety.  Patient to let me know if he would like to proceed with the referral.    We have discussed bariatric surgery   Keep follow up appts with pulmonary, urology and PCP  Return in about 4 weeks (around 07/19/2024).SABRA He was informed of the importance of frequent follow up visits to maximize his success with intensive lifestyle modifications for his multiple  health conditions.   ATTESTASTION STATEMENTS:  Reviewed by clinician on day of visit: allergies, medications, problem list, medical history, surgical history, family history, social history, and previous encounter notes.     Steve Rojas SAUNDERS. Tyrisha Benninger FNP-C

## 2024-07-05 ENCOUNTER — Encounter

## 2024-07-05 DIAGNOSIS — G4730 Sleep apnea, unspecified: Secondary | ICD-10-CM | POA: Diagnosis not present

## 2024-07-05 DIAGNOSIS — G4719 Other hypersomnia: Secondary | ICD-10-CM

## 2024-07-10 DIAGNOSIS — E119 Type 2 diabetes mellitus without complications: Secondary | ICD-10-CM | POA: Diagnosis not present

## 2024-07-17 DIAGNOSIS — R069 Unspecified abnormalities of breathing: Secondary | ICD-10-CM | POA: Diagnosis not present

## 2024-07-18 ENCOUNTER — Telehealth: Payer: Self-pay

## 2024-07-18 ENCOUNTER — Encounter: Payer: Self-pay | Admitting: Nurse Practitioner

## 2024-07-18 ENCOUNTER — Ambulatory Visit: Admitting: Nurse Practitioner

## 2024-07-18 VITALS — BP 140/78 | HR 74 | Temp 98.3°F | Ht >= 80 in | Wt >= 6400 oz

## 2024-07-18 DIAGNOSIS — G4733 Obstructive sleep apnea (adult) (pediatric): Secondary | ICD-10-CM

## 2024-07-18 DIAGNOSIS — E66813 Obesity, class 3: Secondary | ICD-10-CM | POA: Diagnosis not present

## 2024-07-18 DIAGNOSIS — E119 Type 2 diabetes mellitus without complications: Secondary | ICD-10-CM | POA: Diagnosis not present

## 2024-07-18 DIAGNOSIS — Z6841 Body Mass Index (BMI) 40.0 and over, adult: Secondary | ICD-10-CM | POA: Diagnosis not present

## 2024-07-18 DIAGNOSIS — Z7985 Long-term (current) use of injectable non-insulin antidiabetic drugs: Secondary | ICD-10-CM

## 2024-07-18 MED ORDER — TIRZEPATIDE 12.5 MG/0.5ML ~~LOC~~ SOAJ: 12.5000 mg | SUBCUTANEOUS | 0 refills | Status: DC

## 2024-07-18 NOTE — Telephone Encounter (Signed)
Appointment info sent to patient.

## 2024-07-18 NOTE — Progress Notes (Signed)
 Office: [phone]  /  Fax: [phone]  WEIGHT SUMMARY AND BIOMETRICS  Weight Lost Since Last Visit: 5lb  Weight Gained Since Last Visit: 0lb   Vitals Temp: 98.3 F (36.8 C) BP: (!) 147/82 Pulse Rate: 74 SpO2: 96 %   Anthropometric Measurements Height: 6' 8 (2.032 m) Weight: (!) 434 lb (196.9 kg) BMI (Calculated): 47.68 Weight at Last Visit: 439lb Weight Lost Since Last Visit: 5lb Weight Gained Since Last Visit: 0lb Starting Weight: 443lb Total Weight Loss (lbs): 9 lb (4.082 kg)   Body Composition  Body Fat %: 44.7 % Fat Mass (lbs): 194 lbs Muscle Mass (lbs): 228.4 lbs Total Body Water (lbs): 180.2 lbs Visceral Fat Rating : 32   Other Clinical Data Fasting: No Labs: No Today's Visit #: 19 Starting Date: 02/18/23     HPI  Chief Complaint: OBESITY  Hayward is here to discuss his progress with his obesity treatment plan. He is on the keeping a food journal and adhering to recommended goals of 2000 calories and 120 protein and states he is following his eating plan approximately 70 % of the time. He states he is exercising 60 minutes 2-3 days per week.   Interval History:  Since last office visit he has lost 5 pounds.  He fell on 06/22/24 and has been struggling with exercising due to falling on his right knee and left hand. He has not been tracking since his last visit. He is trying to making healthier choices and is watching his portion sizes.  He watches his grandchildren and notes at times he struggles with choices because of having the kids around. He is focusing on eating more protein.  He is drinking water, tea with Splenda, zero cal drinks and diet cokes.   Pharmacotherapy for weight loss: He is not currently taking medications  for medical weight loss.   Previous pharmacotherapy for medical weight loss:  none  Bariatric surgery:  Patient has not had bariatric surgery  Pharmacotherapy for DMT2:   He is currently taking Mounjaro  12.5 mg dose,  Amaryl 2mg  and Metformin  XR 500mg  1 tablet am and 2 pm.  Reports occ side effects of nausea and constipation.  He took Actos once since his last visit.   Last A1c was 7.1 on 03/28/24 CBGs: Fasting 173 (highest-was at a trade show and was eating off track), average 125, low 88 Episodes of hypoglycemia: no Taking ASA 81mg  and Lipitor 20mg  Last eye exam:  07/06/24 He has tried Ozempic  in the past.    No results found for: HGBA1C Lab Results  Component Value Date   LDLCALC 81 07/22/2023   CREATININE 0.89 07/22/2023    Obstructive Sleep Apnea Lash had a HST 07/05/24.  He had an RDI of 37.4 with oxygen desats of 70.    He is scheduled to see pulmonary on 08/07/24. He's eager to see pulmonary and start CPAP.            PHYSICAL EXAM:  Blood pressure (!) 147/82, pulse 74, temperature 98.3 F (36.8 C), height 6' 8 (2.032 m), weight (!) 434 lb (196.9 kg), SpO2 96%. Body mass index is 47.68 kg/m.  General: He is overweight, cooperative, alert, well developed, and in no acute distress. PSYCH: Has normal mood, affect and thought process.   Extremities: No edema.  Neurologic: No gross sensory or motor deficits. No tremors or fasciculations noted.    DIAGNOSTIC DATA REVIEWED:  BMET    Component Value Date/Time   NA 138 07/22/2023 0933  K 4.4 07/22/2023 0933   CL 100 07/22/2023 0933   CO2 22 07/22/2023 0933   GLUCOSE 114 (H) 07/22/2023 0933   BUN 14 07/22/2023 0933   CREATININE 0.89 07/22/2023 0933   CALCIUM 9.8 07/22/2023 0933   No results found for: HGBA1C Lab Results  Component Value Date   INSULIN  9.4 02/18/2023   Lab Results  Component Value Date   TSH 3.200 07/22/2023   CBC    Component Value Date/Time   WBC 6.0 07/22/2023 0933   RBC 5.29 07/22/2023 0933   HGB 15.4 07/22/2023 0933   HCT 45.3 07/22/2023 0933   PLT 287 07/22/2023 0933   MCV 86 07/22/2023 0933   MCH 29.1 07/22/2023 0933   MCHC 34.0 07/22/2023 0933   RDW 13.3 07/22/2023 0933   Iron  Studies No results found for: IRON, TIBC, FERRITIN, IRONPCTSAT Lipid Panel     Component Value Date/Time   CHOL 142 07/22/2023 0933   TRIG 82 07/22/2023 0933   HDL 45 07/22/2023 0933   LDLCALC 81 07/22/2023 0933   Hepatic Function Panel     Component Value Date/Time   PROT 6.9 07/22/2023 0933   ALBUMIN 4.3 07/22/2023 0933   AST 13 07/22/2023 0933   ALT 21 07/22/2023 0933   ALKPHOS 102 07/22/2023 0933   BILITOT 0.9 07/22/2023 0933      Component Value Date/Time   TSH 3.200 07/22/2023 0933   Nutritional Lab Results  Component Value Date   VD25OH 43.2 07/22/2023   VD25OH 22.5 (L) 02/18/2023     ASSESSMENT AND PLAN  TREATMENT PLAN FOR OBESITY:  Recommended Dietary Goals  Steve Rojas is currently in the action stage of change. As such, his goal is to continue weight management plan. He has agreed to practicing portion control and making smarter food choices, such as increasing vegetables and decreasing simple carbohydrates.  Behavioral Intervention  We discussed the following Behavioral Modification Strategies today: increasing lean protein intake to established goals, decreasing simple carbohydrates , increasing vegetables, increasing fiber rich foods, increasing water intake , work on meal planning and preparation, reading food labels , keeping healthy foods at home, planning for success, better snacking choices, continue to work on maintaining a reduced calorie state, getting the recommended amount of protein, incorporating whole foods, making healthy choices, staying well hydrated and practicing mindfulness when eating., and increase protein intake, fibrous foods (25 grams per day for women, 30 grams for men) and water to improve satiety and decrease hunger signals. .  Additional resources provided today: NA  Recommended Physical Activity Goals  Jaxxson has been advised to work up to 150 minutes of moderate intensity aerobic activity a week and strengthening exercises  2-3 times per week for cardiovascular health, weight loss maintenance and preservation of muscle mass.   He has agreed to Think about enjoyable ways to increase daily physical activity and overcoming barriers to exercise, Increase physical activity in their day and reduce sedentary time (increase NEAT)., Continue to gradually increase the amount and intensity of exercise routine, and Combine aerobic and strengthening exercises for efficiency and improved cardiometabolic health.   ASSOCIATED CONDITIONS ADDRESSED TODAY  Action/Plan  Type 2 diabetes mellitus without complication, without long-term current use of insulin  (HCC) -     Continue Tirzepatide ; Inject 12.5 mg into the skin once a week.  Dispense: 2 mL; Refill: 0  Obstructive sleep apnea syndrome Keep appointment scheduled with pulmonary  Class 3 severe obesity due to excess calories with body mass index (BMI) of  45.0 to 49.9 in adult     Will obtain labs at next visit  Patient is requesting to be referred to a new family provider in [place].  I contacted the family office and was able to get him a new patient appt on 07/27/24 with Dr. Sol.    Return in about 4 weeks (around 08/15/2024).SABRA He was informed of the importance of frequent follow up visits to maximize his success with intensive lifestyle modifications for his multiple health conditions.  I personally spent a total of 51 minutes in the care of the patient today including preparing to see the patient, getting/reviewing separately obtained history, performing a medically appropriate exam/evaluation, counseling and educating, referring and communicating with other health care professionals, documenting clinical information in the EHR, communicating results, and coordinating care.    ATTESTASTION STATEMENTS:  Reviewed by clinician on day of visit: allergies, medications, problem list, medical history, surgical history, family history, social history, and previous encounter  notes.     Corean SAUNDERS. Veto Macqueen FNP-C

## 2024-07-27 ENCOUNTER — Ambulatory Visit: Admitting: Family Medicine

## 2024-08-07 ENCOUNTER — Ambulatory Visit: Admitting: Nurse Practitioner

## 2024-08-08 ENCOUNTER — Ambulatory Visit: Admitting: Nurse Practitioner

## 2024-08-08 ENCOUNTER — Encounter: Payer: Self-pay | Admitting: Nurse Practitioner

## 2024-08-08 VITALS — BP 130/80 | HR 77 | Temp 98.0°F | Ht >= 80 in | Wt >= 6400 oz

## 2024-08-08 DIAGNOSIS — E559 Vitamin D deficiency, unspecified: Secondary | ICD-10-CM | POA: Diagnosis not present

## 2024-08-08 DIAGNOSIS — R5383 Other fatigue: Secondary | ICD-10-CM

## 2024-08-08 DIAGNOSIS — E538 Deficiency of other specified B group vitamins: Secondary | ICD-10-CM

## 2024-08-08 DIAGNOSIS — Z7985 Long-term (current) use of injectable non-insulin antidiabetic drugs: Secondary | ICD-10-CM

## 2024-08-08 DIAGNOSIS — Z79899 Other long term (current) drug therapy: Secondary | ICD-10-CM | POA: Diagnosis not present

## 2024-08-08 DIAGNOSIS — Z6841 Body Mass Index (BMI) 40.0 and over, adult: Secondary | ICD-10-CM

## 2024-08-08 DIAGNOSIS — E785 Hyperlipidemia, unspecified: Secondary | ICD-10-CM

## 2024-08-08 DIAGNOSIS — E119 Type 2 diabetes mellitus without complications: Secondary | ICD-10-CM | POA: Diagnosis not present

## 2024-08-08 DIAGNOSIS — G4733 Obstructive sleep apnea (adult) (pediatric): Secondary | ICD-10-CM

## 2024-08-08 DIAGNOSIS — E66813 Obesity, class 3: Secondary | ICD-10-CM

## 2024-08-08 DIAGNOSIS — E1169 Type 2 diabetes mellitus with other specified complication: Secondary | ICD-10-CM | POA: Diagnosis not present

## 2024-08-08 MED ORDER — VITAMIN D (ERGOCALCIFEROL) 1.25 MG (50000 UNIT) PO CAPS: 50000.0000 [IU] | ORAL_CAPSULE | ORAL | 0 refills | Status: DC

## 2024-08-08 MED ORDER — TIRZEPATIDE 12.5 MG/0.5ML ~~LOC~~ SOAJ: 12.5000 mg | SUBCUTANEOUS | 0 refills | Status: DC

## 2024-08-08 NOTE — Progress Notes (Signed)
 Office: [phone]  /  Fax: [phone]  WEIGHT SUMMARY AND BIOMETRICS  Weight Lost Since Last Visit: 2lb  Weight Gained Since Last Visit: 0lb   Vitals Temp: 98 F (36.7 C) BP: 130/80 Pulse Rate: 77 SpO2: 99 %   Anthropometric Measurements Height: 6' 8 (2.032 m) Weight: (!) 432 lb (196 kg) BMI (Calculated): 47.46 Weight at Last Visit: 434lb Weight Lost Since Last Visit: 2lb Weight Gained Since Last Visit: 0lb Starting Weight: 443lb Total Weight Loss (lbs): 11 lb (4.99 kg)   No data recorded Other Clinical Data Fasting: Yes Labs: Yes Today's Visit #: 20 Starting Date: 02/18/23     HPI  Chief Complaint: OBESITY  Steve Rojas is here to discuss his progress with his obesity treatment plan. He is on the keeping a food journal and adhering to recommended goals of 2000+ calories and 120 protein and states he is following his eating plan approximately 75 % of the time. He states he is exercising 60 minutes 3 days per week.   Interval History:  Since last office visit he has lost 2 pounds.   He is watching his portion sizes (feels they have gotten smaller-he's eating one sandwich instead of two) and is trying to make healthier choices.  He has had a couple bad days. Has been eating more junk.  He is drinking water, sweet tea and diet sodas.   He is going to the gym 2-3 days per week.  He is riding the bike and doing some light weights.  He is walking some.     He has an appt with his new PCP tomorrow He has an appt with pulmonary 08/14/24   Pharmacotherapy for weight loss: He is not currently taking medications  for medical weight loss.    Previous pharmacotherapy for medical weight loss:  none   Bariatric surgery:  Patient has not had bariatric surgery  Pharmacotherapy for DMT2:   He is currently taking Mounjaro  12.5 mg dose, Amaryl 2mg  and Metformin  XR 500mg  1 tablet am and 2 pm.  Reports rarely side effects of nausea and constipation based upon what he eats.    Last A1c was 7.1 on 03/28/24 CBGs: 115-159-average 129 Episodes of hypoglycemia: Denies  He is taking ASA 81mg  and Lipitor 20mg  Last eye exam:  07/06/24 He has tried Ozempic  in the past.  No results found for: HGBA1C Lab Results  Component Value Date   LDLCALC 81 07/22/2023   CREATININE 0.89 07/22/2023    Obstructive Sleep Apnea Steve Rojas has a diagnosis of sleep apnea. He reports that he is not using a CPAP regularly. He has an appt with pulmonary on 08/14/24. Notes fatigue.    Hyperlipidemia Medication(s): Lipitor 20mg . Denies side effects.    Lab Results  Component Value Date   CHOL 142 07/22/2023   HDL 45 07/22/2023   LDLCALC 81 07/22/2023   TRIG 82 07/22/2023   Lab Results  Component Value Date   ALT 21 07/22/2023   AST 13 07/22/2023   ALKPHOS 102 07/22/2023   BILITOT 0.9 07/22/2023   The 10-year ASCVD risk score (Arnett DK, et al., 2019) is: 14.3%   Values used to calculate the score:     Age: 63 years     Clincally relevant sex: Male     Is Non-Hispanic African American: No     Diabetic: Yes     Tobacco smoker: No     Systolic Blood Pressure: 130 mmHg     Is BP treated: No  HDL Cholesterol: 45 mg/dL     Total Cholesterol: 142 mg/dL   Vit D deficiency  He is taking Vit D 50,000 IU weekly.  Denies side effects.  Denies nausea, vomiting or muscle weakness.    Lab Results  Component Value Date   VD25OH 43.2 07/22/2023   VD25OH 22.5 (L) 02/18/2023    History of Vit B12 def He is not currently taking Vit B12.  He is on Metformin  XR 500mg  1 am and 2 pm  PHYSICAL EXAM:  Blood pressure 130/80, pulse 77, temperature 98 F (36.7 C), height 6' 8 (2.032 m), weight (!) 432 lb (196 kg), SpO2 99%. Body mass index is 47.46 kg/m.  General: He is overweight, cooperative, alert, well developed, and in no acute distress. PSYCH: Has normal mood, affect and thought process.   Extremities: No edema.  Neurologic: No gross sensory or motor deficits. No tremors or  fasciculations noted.    DIAGNOSTIC DATA REVIEWED:  BMET    Component Value Date/Time   NA 138 07/22/2023 0933   K 4.4 07/22/2023 0933   CL 100 07/22/2023 0933   CO2 22 07/22/2023 0933   GLUCOSE 114 (H) 07/22/2023 0933   BUN 14 07/22/2023 0933   CREATININE 0.89 07/22/2023 0933   CALCIUM 9.8 07/22/2023 0933   No results found for: HGBA1C Lab Results  Component Value Date   INSULIN  9.4 02/18/2023   Lab Results  Component Value Date   TSH 3.200 07/22/2023   CBC    Component Value Date/Time   WBC 6.0 07/22/2023 0933   RBC 5.29 07/22/2023 0933   HGB 15.4 07/22/2023 0933   HCT 45.3 07/22/2023 0933   PLT 287 07/22/2023 0933   MCV 86 07/22/2023 0933   MCH 29.1 07/22/2023 0933   MCHC 34.0 07/22/2023 0933   RDW 13.3 07/22/2023 0933   Iron Studies No results found for: IRON, TIBC, FERRITIN, IRONPCTSAT Lipid Panel     Component Value Date/Time   CHOL 142 07/22/2023 0933   TRIG 82 07/22/2023 0933   HDL 45 07/22/2023 0933   LDLCALC 81 07/22/2023 0933   Hepatic Function Panel     Component Value Date/Time   PROT 6.9 07/22/2023 0933   ALBUMIN 4.3 07/22/2023 0933   AST 13 07/22/2023 0933   ALT 21 07/22/2023 0933   ALKPHOS 102 07/22/2023 0933   BILITOT 0.9 07/22/2023 0933      Component Value Date/Time   TSH 3.200 07/22/2023 0933   Nutritional Lab Results  Component Value Date   VD25OH 43.2 07/22/2023   VD25OH 22.5 (L) 02/18/2023     ASSESSMENT AND PLAN  TREATMENT PLAN FOR OBESITY:  Recommended Dietary Goals  Steve Rojas is currently in the action stage of change. As such, his goal is to continue weight management plan. He has agreed to practicing portion control and making smarter food choices, such as increasing vegetables and decreasing simple carbohydrates.  Behavioral Intervention  We discussed the following Behavioral Modification Strategies today: increasing lean protein intake to established goals, decreasing simple carbohydrates , increasing  fiber rich foods, increasing water intake , work on meal planning and preparation, reading food labels , keeping healthy foods at home, continue to work on maintaining a reduced calorie state, getting the recommended amount of protein, incorporating whole foods, making healthy choices, staying well hydrated and practicing mindfulness when eating., and increase protein intake, fibrous foods (25 grams per day for women, 30 grams for men) and water to improve satiety and decrease hunger signals. Steve Rojas  Additional resources provided today: NA  Recommended Physical Activity Goals  Steve Rojas has been advised to work up to 150 minutes of moderate intensity aerobic activity a week and strengthening exercises 2-3 times per week for cardiovascular health, weight loss maintenance and preservation of muscle mass.   He has agreed to Think about enjoyable ways to increase daily physical activity and overcoming barriers to exercise, Increase physical activity in their day and reduce sedentary time (increase NEAT)., Start strengthening exercises with a goal of 2-3 sessions a week , Continue to gradually increase the amount and intensity of exercise routine, and Combine aerobic and strengthening exercises for efficiency and improved cardiometabolic health.   ASSOCIATED CONDITIONS ADDRESSED TODAY  Action/Plan  Type 2 diabetes mellitus without complication, without long-term current use of insulin  (HCC) -     CBC with Differential/Platelet -     Hemoglobin A1c -     Tirzepatide ; Inject 12.5 mg into the skin once a week.  Dispense: 2 mL; Refill: 0  Obstructive sleep apnea syndrome Keep appt with pulmonary  Vitamin D  deficiency -     VITAMIN D  25 Hydroxy (Vit-D Deficiency, Fractures) -     Vitamin D  (Ergocalciferol ); Take 1 capsule (50,000 Units total) by mouth every 7 (seven) days.  Dispense: 5 capsule; Refill: 0  B12 deficiency -     Vitamin B12  Hyperlipidemia associated with type 2 diabetes mellitus (HCC) -      Lipid Panel With LDL/HDL Ratio  Keep appt with PCP.  Continue Lipitor 20mg  daily.    Medication management -     Comprehensive metabolic panel with GFR -     VITAMIN D  25 Hydroxy (Vit-D Deficiency, Fractures) -     TSH -     Lipid Panel With LDL/HDL Ratio -     Vitamin B12 -     CBC with Differential/Platelet -     Hemoglobin A1c  Other fatigue -     Comprehensive metabolic panel with GFR -     VITAMIN D  25 Hydroxy (Vit-D Deficiency, Fractures) -     TSH -     Lipid Panel With LDL/HDL Ratio -     Vitamin B12 -     CBC with Differential/Platelet -     Hemoglobin A1c  Class 3 severe obesity due to excess calories with body mass index (BMI) of 45.0 to 49.9 in adult -     TSH      Goals: To start exercising 4-5 days per week Increase water intake   Return in about 5 weeks (around 09/12/2024).Steve Rojas He was informed of the importance of frequent follow up visits to maximize his success with intensive lifestyle modifications for his multiple health conditions.   ATTESTASTION STATEMENTS:  Reviewed by clinician on day of visit: allergies, medications, problem list, medical history, surgical history, family history, social history, and previous encounter notes.     Steve Rojas. Quenten Nawaz FNP-C

## 2024-08-09 ENCOUNTER — Ambulatory Visit: Admitting: Family Medicine

## 2024-08-09 ENCOUNTER — Encounter: Payer: Self-pay | Admitting: Family Medicine

## 2024-08-09 ENCOUNTER — Telehealth: Payer: Self-pay

## 2024-08-09 ENCOUNTER — Other Ambulatory Visit (HOSPITAL_COMMUNITY): Payer: Self-pay

## 2024-08-09 ENCOUNTER — Ambulatory Visit: Payer: Self-pay | Admitting: Nurse Practitioner

## 2024-08-09 VITALS — BP 136/82 | HR 61 | Ht >= 80 in | Wt >= 6400 oz

## 2024-08-09 DIAGNOSIS — E119 Type 2 diabetes mellitus without complications: Secondary | ICD-10-CM | POA: Diagnosis not present

## 2024-08-09 DIAGNOSIS — G4733 Obstructive sleep apnea (adult) (pediatric): Secondary | ICD-10-CM | POA: Insufficient documentation

## 2024-08-09 DIAGNOSIS — I10 Essential (primary) hypertension: Secondary | ICD-10-CM

## 2024-08-09 DIAGNOSIS — E785 Hyperlipidemia, unspecified: Secondary | ICD-10-CM

## 2024-08-09 DIAGNOSIS — Z7985 Long-term (current) use of injectable non-insulin antidiabetic drugs: Secondary | ICD-10-CM

## 2024-08-09 DIAGNOSIS — Z7984 Long term (current) use of oral hypoglycemic drugs: Secondary | ICD-10-CM

## 2024-08-09 LAB — CBC WITH DIFFERENTIAL/PLATELET
Basophils Absolute: 0 x10E3/uL (ref 0.0–0.2)
Basos: 1 %
EOS (ABSOLUTE): 0.1 x10E3/uL (ref 0.0–0.4)
Eos: 2 %
Hematocrit: 47.6 % (ref 37.5–51.0)
Hemoglobin: 15.7 g/dL (ref 13.0–17.7)
Immature Grans (Abs): 0 x10E3/uL (ref 0.0–0.1)
Immature Granulocytes: 0 %
Lymphocytes Absolute: 1.7 x10E3/uL (ref 0.7–3.1)
Lymphs: 31 %
MCH: 28.6 pg (ref 26.6–33.0)
MCHC: 33 g/dL (ref 31.5–35.7)
MCV: 87 fL (ref 79–97)
Monocytes Absolute: 0.4 x10E3/uL (ref 0.1–0.9)
Monocytes: 7 %
Neutrophils Absolute: 3.2 x10E3/uL (ref 1.4–7.0)
Neutrophils: 59 %
Platelets: 264 x10E3/uL (ref 150–450)
RBC: 5.48 x10E6/uL (ref 4.14–5.80)
RDW: 14.4 % (ref 11.6–15.4)
WBC: 5.4 x10E3/uL (ref 3.4–10.8)

## 2024-08-09 LAB — LIPID PANEL WITH LDL/HDL RATIO
Cholesterol, Total: 157 mg/dL (ref 100–199)
HDL: 45 mg/dL (ref >39)
LDL Chol Calc (NIH): 96 mg/dL (ref 0–99)
LDL/HDL Ratio: 2.1 ratio (ref 0.0–3.6)
Triglycerides: 83 mg/dL (ref 0–149)
VLDL Cholesterol Cal: 16 mg/dL (ref 5–40)

## 2024-08-09 LAB — COMPREHENSIVE METABOLIC PANEL WITH GFR
ALT: 32 IU/L (ref 0–44)
AST: 25 IU/L (ref 0–40)
Albumin: 4.6 g/dL (ref 3.9–4.9)
Alkaline Phosphatase: 99 IU/L (ref 47–123)
BUN/Creatinine Ratio: 15 (ref 10–24)
BUN: 14 mg/dL (ref 8–27)
Bilirubin Total: 1 mg/dL (ref 0.0–1.2)
CO2: 21 mmol/L (ref 20–29)
Calcium: 10.3 mg/dL — ABNORMAL HIGH (ref 8.6–10.2)
Chloride: 100 mmol/L (ref 96–106)
Creatinine, Ser: 0.93 mg/dL (ref 0.76–1.27)
Globulin, Total: 2.1 g/dL (ref 1.5–4.5)
Glucose: 102 mg/dL — ABNORMAL HIGH (ref 70–99)
Potassium: 4.5 mmol/L (ref 3.5–5.2)
Sodium: 139 mmol/L (ref 134–144)
Total Protein: 6.7 g/dL (ref 6.0–8.5)
eGFR: 93 mL/min/1.73 (ref >59)

## 2024-08-09 LAB — HEMOGLOBIN A1C
Est. average glucose Bld gHb Est-mCnc: 140 mg/dL
Hgb A1c MFr Bld: 6.5 % — ABNORMAL HIGH (ref 4.8–5.6)

## 2024-08-09 LAB — TSH: TSH: 2.68 u[IU]/mL (ref 0.450–4.500)

## 2024-08-09 LAB — VITAMIN B12: Vitamin B-12: 390 pg/mL (ref 232–1245)

## 2024-08-09 LAB — VITAMIN D 25 HYDROXY (VIT D DEFICIENCY, FRACTURES): Vit D, 25-Hydroxy: 33 ng/mL (ref 30.0–100.0)

## 2024-08-09 MED ORDER — ATORVASTATIN CALCIUM 40 MG PO TABS: 40.0000 mg | ORAL_TABLET | Freq: Every day | ORAL | 3 refills | Status: AC

## 2024-08-09 MED ORDER — DAPAGLIFLOZIN PROPANEDIOL 5 MG PO TABS: 5.0000 mg | ORAL_TABLET | Freq: Every day | ORAL | 3 refills | Status: AC

## 2024-08-09 MED ORDER — LISINOPRIL 5 MG PO TABS: 5.0000 mg | ORAL_TABLET | Freq: Every day | ORAL | 3 refills | Status: AC

## 2024-08-09 NOTE — Telephone Encounter (Signed)
 Please review outcome of PA

## 2024-08-09 NOTE — Telephone Encounter (Signed)
 Per test claim: Refill too soon. PA is not needed at this time. Medication was filled 08/09/24. Next eligible fill date is 10/16/24.

## 2024-08-09 NOTE — Progress Notes (Signed)
 New Patient Office Visit  Subjective    Patient ID: Steve Rojas, male    DOB: 05-15-1961  Age: 63 y.o. MRN: [id number]  CC:  Chief Complaint  Patient presents with   Establish Care    Patient is here to establish care, previous provider left practice and looking for new primary care provider      Assessment & Plan:   Type 2 diabetes mellitus without complication, without long-term current use of insulin  (HCC) -     Lisinopril; Take 1 tablet (5 mg total) by mouth daily.  Dispense: 90 tablet; Refill: 3 -     Dapagliflozin Propanediol; Take 1 tablet (5 mg total) by mouth daily.  Dispense: 90 tablet; Refill: 3  Essential hypertension -     Lisinopril; Take 1 tablet (5 mg total) by mouth daily.  Dispense: 90 tablet; Refill: 3  Dyslipidemia -     Atorvastatin Calcium; Take 1 tablet (40 mg total) by mouth daily.  Dispense: 90 tablet; Refill: 3  Long-term current use of injectable noninsulin antidiabetic medication  Long term current use of oral hypoglycemic drug  Morbid obesity (HCC)   Assessment and Plan Assessment & Plan Type 2 diabetes mellitus Type 2 diabetes mellitus for over 20 years with A1c of 6.5%, indicating good control. Blood sugar levels average 113 mg/dL. Glimepiride poses hypoglycemia risk. Doreen considered for cardiovascular and renal benefits. - Discontinue Actos and glimepiride. - Continue metformin  and Mounjaro . - Initiate Farxiga, provide sample, and check insurance coverage. - Monitor blood sugar levels and report hypoglycemia or urinary tract infection symptoms. - Schedule follow-up in one month to assess diabetes management.  Obesity Obesity with weight management challenges. Currently on Mounjaro  without issues. Participating in weight loss clinic and following 2000 calorie diet. Bariatric surgery not preferred. - Continue Mounjaro  for weight management. - Maintain 2000 calorie diet with specified protein intake. - Encourage physical activity once  sleep apnea is managed with CPAP. - Discuss weight management progress at follow-up.  Dyslipidemia Dyslipidemia with recent cholesterol level of 96 mg/dL. Family history of heart disease and increased cardiovascular risk due to diabetes and obesity. - Increase Lipitor to 40 mg every day. - Monitor cholesterol levels and cardiovascular risk factors.  Hypertension Hypertension with recent readings of 136/82 mmHg. Lisinopril considered for renal protection due to diabetes. - Initiate lisinopril 5 mg for renal protection and blood pressure management. - Monitor for side effects such as dry cough, angioedema, and lightheadedness. - Advise on cautious position changes to prevent dizziness.  General Health Maintenance General health maintenance includes vaccinations and routine screenings. Flu vaccination is due. - Administer flu shot at next visit.  Follow-Up Follow-up plans to monitor diabetes management and medication tolerance. - Schedule follow-up appointment in one month to assess diabetes management and medication effects.    Return in about 4 weeks (around 09/06/2024) for annual.   Vinary K Virgilia Quigg, MD   HPI    Steve Rojas presents to establish care Discussed the use of AI scribe software for clinical note transcription with the patient, who gave verbal consent to proceed.  History of Present Illness Steve Rojas is a 63 year old male with diabetes who presents to establish care.  He has been managing diabetes for over 20 years with metformin , Mounjaro , and glimepiride. His recent A1c is 6.5%. He discontinued Actos about a month and a half ago. His home blood sugar readings average 113 mg/dL, with a range from 882 mg/dL to 840 mg/dL. He  generally maintains good blood sugar control but experiences fluctuations when deviating from his diet.  He has severe sleep apnea, confirmed by a sleep study, and experiences significant fatigue. He anticipates improvement in energy  levels with CPAP therapy. A follow-up appointment is scheduled for the sixth of the month.  He is addressing weight management issues and has been attending a wellness clinic for over a year. He initially lost weight on Ozempic  but experienced severe nausea, leading to weight regain. He is currently on Mounjaro  without significant issues. He follows a 2000 calorie diet focusing on protein intake but is too fatigued for physical activity due to sleep apnea.  He has a history of vitamin D  deficiency, managed with a weekly capsule, and levels are now normal. He also takes aspirin, cholesterol medication, Zyrtec  for allergies, vitamin B12 occasionally, and uses laxatives and nasal spray as needed.  He has prostate issues managed with Flomax and Viagra . He has consulted a urologist due to concerns about PSA levels, which have decreased from 5.3 to 5.1. A follow-up appointment is scheduled for January.  He experiences occasional right-sided chest pain and increased indigestion at night, which he manages with Tums and water. Spicy foods exacerbate these symptoms.  His past medical history includes two partial knee implants and back issues. He does not smoke and drinks alcohol rarely. He is semi-retired, running a small business, and has comprehensive insurance through his wife's employment.    Outpatient Encounter Medications as of 08/09/2024  Medication Sig   aspirin 81 MG tablet Take 81 mg by mouth daily.   atorvastatin (LIPITOR) 40 MG tablet Take 1 tablet (40 mg total) by mouth daily.   cetirizine -pseudoephedrine  (ZYRTEC -D) 5-120 MG tablet Take 1 tablet by mouth daily.   Cyanocobalamin  (B-12 PO) Take 1 tablet by mouth daily.   dapagliflozin propanediol (FARXIGA) 5 MG TABS tablet Take 1 tablet (5 mg total) by mouth daily.   Docusate Sodium (DSS) 100 MG CAPS Take by mouth.   fluticasone  (FLONASE ) 50 MCG/ACT nasal spray Place 2 sprays into both nostrils daily.   ibuprofen  (ADVIL ,MOTRIN ) 600 MG tablet  Take 1 tablet (600 mg total) by mouth 3 (three) times daily. (Patient taking differently: Take 600 mg by mouth every 8 (eight) hours as needed.)   lisinopril (ZESTRIL) 5 MG tablet Take 1 tablet (5 mg total) by mouth daily.   metFORMIN  (GLUCOPHAGE -XR) 500 MG 24 hr tablet TAKE 3 TABLETS (1,500 MG TOTAL) BY MOUTH DAILY   sildenafil  (VIAGRA ) 100 MG tablet Take 1 tablet (100 mg total) by mouth daily as needed for erectile dysfunction (take 45 minutes prior to sexual activity).   tadalafil (CIALIS) 20 MG tablet Take 1 tablet by mouth daily as needed. As directed for ED   tamsulosin (FLOMAX) 0.4 MG CAPS capsule Take 0.4 mg by mouth daily.   tirzepatide  (MOUNJARO ) 12.5 MG/0.5ML Pen Inject 12.5 mg into the skin once a week.   Vitamin D , Ergocalciferol , (DRISDOL ) 1.25 MG (50000 UNIT) CAPS capsule Take 1 capsule (50,000 Units total) by mouth every 7 (seven) days.   [DISCONTINUED] atorvastatin (LIPITOR) 20 MG tablet Take 20 mg by mouth daily.   [DISCONTINUED] glimepiride (AMARYL) 2 MG tablet Take 1 tablet by mouth daily.   [DISCONTINUED] glimepiride (AMARYL) 4 MG tablet Take 4 mg by mouth daily with breakfast.   [DISCONTINUED] pioglitazone (ACTOS) 30 MG tablet Take 30 mg by mouth daily.   No facility-administered encounter medications on file as of 08/09/2024.      Review of Systems  All other systems reviewed and are negative.       Objective    BP 136/82   Pulse 61   Ht 6' 8 (2.032 m)   Wt (!) 435 lb (197.3 kg)   SpO2 98%   BMI 47.79 kg/m   Physical Exam Vitals and nursing note reviewed.  Constitutional:      Appearance: Normal appearance.  HENT:     Head: Normocephalic.     Right Ear: External ear normal.     Left Ear: External ear normal.  Eyes:     Conjunctiva/sclera: Conjunctivae normal.  Cardiovascular:     Rate and Rhythm: Normal rate.  Pulmonary:     Effort: Pulmonary effort is normal. No respiratory distress.  Abdominal:     Palpations: Abdomen is soft.   Musculoskeletal:        General: Normal range of motion.  Skin:    General: Skin is warm.  Neurological:     Mental Status: He is alert and oriented to person, place, and time.  Psychiatric:        Mood and Affect: Mood normal.

## 2024-08-14 ENCOUNTER — Ambulatory Visit (INDEPENDENT_AMBULATORY_CARE_PROVIDER_SITE_OTHER): Admitting: Nurse Practitioner

## 2024-08-14 ENCOUNTER — Encounter: Payer: Self-pay | Admitting: Nurse Practitioner

## 2024-08-14 VITALS — BP 116/74 | HR 97 | Temp 97.9°F | Ht >= 80 in | Wt >= 6400 oz

## 2024-08-14 DIAGNOSIS — G4733 Obstructive sleep apnea (adult) (pediatric): Secondary | ICD-10-CM

## 2024-08-14 DIAGNOSIS — Z6841 Body Mass Index (BMI) 40.0 and over, adult: Secondary | ICD-10-CM | POA: Diagnosis not present

## 2024-08-14 NOTE — Patient Instructions (Addendum)
 Start CPAP 5-20 cmH2O, mask of choice, heated humidity, every night, minimum of 4-6 hours a night.  Change equipment as directed. Wash your tubing with warm soap and water daily, hang to dry. Wash humidifier portion weekly. Use bottled, distilled water and change daily Be aware of reduced alertness and do not drive or operate heavy machinery if experiencing this or drowsiness.  Exercise encouraged, as tolerated. Healthy weight management discussed.  Avoid or decrease alcohol consumption and medications that make you more sleepy, if possible. Notify if persistent daytime sleepiness occurs even with consistent use of PAP therapy.  Change CPAP supplies... Every month Mask cushions and/or nasal pillows CPAP machine filters Every 3 months Mask frame (not including the headgear) CPAP tubing Every 6 months Mask headgear Chin strap (if applicable) Humidifier water tub  Notify us  if you haven't heard something in the next 2 weeks about the new CPAP  Follow up in 10-12 weeks with Steve Tery Hoeger,NP to review CPAP usage. If symptoms do not improve or worsen, please contact office for sooner follow up or seek emergency care.

## 2024-08-14 NOTE — Assessment & Plan Note (Signed)
 Severe OSA on recent HST. Reviewed risks of untreated OSA and potential treatment options. Given severity, recommendation was made to start CPAP therapy, which he was agreeable to. Educated on proper use/care of device. Risks/benefits reviewed. Orders placed for new start auto CPAP 5-20 cmH2O. Healthy weight loss encouraged. Safe driving practices reviewed.  Patient Instructions  Start CPAP 5-20 cmH2O, mask of choice, heated humidity, every night, minimum of 4-6 hours a night.  Change equipment as directed. Wash your tubing with warm soap and water daily, hang to dry. Wash humidifier portion weekly. Use bottled, distilled water and change daily Be aware of reduced alertness and do not drive or operate heavy machinery if experiencing this or drowsiness.  Exercise encouraged, as tolerated. Healthy weight management discussed.  Avoid or decrease alcohol consumption and medications that make you more sleepy, if possible. Notify if persistent daytime sleepiness occurs even with consistent use of PAP therapy.  Change CPAP supplies... Every month Mask cushions and/or nasal pillows CPAP machine filters Every 3 months Mask frame (not including the headgear) CPAP tubing Every 6 months Mask headgear Chin strap (if applicable) Humidifier water tub  Notify us  if you haven't heard something in the next 2 weeks about the new CPAP  Follow up in 10-12 weeks with Katie Bridey Brookover,NP to review CPAP usage. If symptoms do not improve or worsen, please contact office for sooner follow up or seek emergency care.

## 2024-08-14 NOTE — Progress Notes (Signed)
 @Patient  ID: Steve Rojas, male    DOB: September 25, 1961, 63 y.o.   MRN: [id number]  Chief Complaint  Patient presents with   Obstructive Sleep Apnea    Referring provider: Don Lauraine Collar, *  HPI: 63 year old male, former smoker referred for sleep consult.  Past medical history significant for HLD, DM 2.  TEST/EVENTS:  07/05/2024 HST: AHI 32/h, SpO2 low 70%  06/20/2024: OV with Feliza Diven NP RIAN BUSCHE is a 63 year old male presents with frequent nighttime urination and suspected sleep apnea. He experiences frequent nighttime urination, which prompted a consultation with a urologist. The urologist recommended a sleep study due to symptoms of restless sleep, fatigue, and snoring. He is participating in a wellness program to lose weight, and they also encouraged he have a sleep study based on his symptoms. He has a history of type 2 diabetes and initially attributed his fatigue to this condition. His energy levels have significantly decreased since turning 52, with lethargy and a lack of motivation to engage in physical activities. Previously active, he now goes to bed at 9 PM, wakes up multiple times during the night, and feels ready to sleep again by noon. He quit smoking in the past, leading to a weight gain of approximately 100 pounds. He suspects untreated sleep apnea may have contributed to development of diabetes. No waking up choking, gasping, or coughing, and no drowsy driving or sleepwalking. He occasionally wakes up with morning headaches, attributed to sinus pressure changes. His wife has mentioned that he stops breathing during sleep. No sleep paralysis, hx of narcolepsy or cataplexy. He consumes minimal caffeine, primarily through Diet Coke and occasional tea, and has about one alcoholic beverage per month. 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 He lives with his wife. Never had a prior  sleep study. No O2 use. [place] 6  08/14/2024: Today - follow up Discussed the use of AI scribe software for clinical note transcription with the patient, who gave verbal consent to proceed.  History of Present Illness  Steve Rojas is a 63 year old male with severe sleep apnea who presents for management of his condition. He is accompanied by his wife.  He has been diagnosed with severe sleep apnea, confirmed by a recent sleep study. He feels unchanged compared to his last visit. Always tired. No drowsy driving. His wife has CPAP so he is familiar with therapy. Wants to discuss treatment options.     Allergies  Allergen Reactions   Penicillins Other (See Comments)    Had reaction as a child- unsure of what     Immunization History  Administered Date(s) Administered   Influenza Inj Mdck Quad Pf 08/22/2021   Influenza, Mdck, Trivalent,PF 6+ MOS(egg free) 12/03/2023   Influenza,inj,Quad PF,6+ Mos 07/30/2020   Moderna Covid-19 Vaccine Bivalent Booster 58yrs & up 11/13/2021   Moderna Sars-Covid-2 Vaccination 02/12/2020, 03/17/2020, 10/22/2020   Pneumococcal Polysaccharide-23 11/21/2014   Tdap 11/28/2009, 12/15/2019    Past Medical History:  Diagnosis Date   Back pain    Chest pain    Diabetes (HCC)    Hyperlipidemia    Hypochloremia    Joint pain    Vitamin B12 deficiency     Tobacco History: Social History   Tobacco Use  Smoking Status Former   Current packs/day: 0.00   Types: Cigarettes   Quit date: 11/09/1992   Years since quitting: 31.7  Smokeless Tobacco Never   Counseling  given: Not Answered   Outpatient Medications Prior to Visit  Medication Sig Dispense Refill   aspirin 81 MG tablet Take 81 mg by mouth daily.     atorvastatin (LIPITOR) 40 MG tablet Take 1 tablet (40 mg total) by mouth daily. 90 tablet 3   cetirizine -pseudoephedrine  (ZYRTEC -D) 5-120 MG tablet Take 1 tablet by mouth daily. 30 tablet 0   Cyanocobalamin  (B-12 PO) Take 1 tablet by mouth  daily.     dapagliflozin propanediol (FARXIGA) 5 MG TABS tablet Take 1 tablet (5 mg total) by mouth daily. 90 tablet 3   Docusate Sodium (DSS) 100 MG CAPS Take by mouth.     fluticasone  (FLONASE ) 50 MCG/ACT nasal spray Place 2 sprays into both nostrils daily. 16 g 0   ibuprofen  (ADVIL ,MOTRIN ) 600 MG tablet Take 1 tablet (600 mg total) by mouth 3 (three) times daily. (Patient taking differently: Take 600 mg by mouth every 8 (eight) hours as needed.) 30 tablet 0   lisinopril (ZESTRIL) 5 MG tablet Take 1 tablet (5 mg total) by mouth daily. 90 tablet 3   metFORMIN  (GLUCOPHAGE -XR) 500 MG 24 hr tablet TAKE 3 TABLETS (1,500 MG TOTAL) BY MOUTH DAILY 270 tablet 0   sildenafil  (VIAGRA ) 100 MG tablet Take 1 tablet (100 mg total) by mouth daily as needed for erectile dysfunction (take 45 minutes prior to sexual activity). 30 tablet 6   tadalafil (CIALIS) 20 MG tablet Take 1 tablet by mouth daily as needed. As directed for ED     tamsulosin (FLOMAX) 0.4 MG CAPS capsule Take 0.4 mg by mouth daily.     tirzepatide  (MOUNJARO ) 12.5 MG/0.5ML Pen Inject 12.5 mg into the skin once a week. 2 mL 0   Vitamin D , Ergocalciferol , (DRISDOL ) 1.25 MG (50000 UNIT) CAPS capsule Take 1 capsule (50,000 Units total) by mouth every 7 (seven) days. 5 capsule 0   No facility-administered medications prior to visit.     Review of Systems:   Constitutional: No recent weight loss or gain, night sweats +fatigue  HEENT: +occasional AM headaches CV:  No chest pain, orthopnea, PND, palpitations Resp: +snoring, witnessed apneas,   GU:+nocturia  Neuro: No memory impairment  Psych: No depression or anxiety. Mood stable. +sleep disturbance     Physical Exam:  BP 116/74   Pulse 97   Temp 97.9 F (36.6 C) (Temporal)   Ht 6' 8 (2.032 m)   Wt (!) 430 lb 12.8 oz (195.4 kg)   SpO2 96%   BMI 47.33 kg/m   GEN: Pleasant, interactive, well-kempt; morbidly obese; in no acute distress HEENT:  Normocephalic and atraumatic. PERRLA.  Sclera white. Nasal turbinates pink, moist and patent bilaterally. No rhinorrhea present. Oropharynx pink and moist, without exudate or edema. No lesions, ulcerations, or postnasal drip. Mallampati IV NECK:  Supple w/ fair ROM. No JVD present. Thyroid  symmetrical with no goiter or nodules palpated. No lymphadenopathy.   CV: RRR, no m/r/g, no peripheral edema. Pulses intact, +2 bilaterally.  PULMONARY:  Unlabored, regular breathing. Clear bilaterally A&P w/o wheezes/rales/rhonchi. No accessory muscle use.  GI: BS present and normoactive. Soft, non-tender to palpation. No organomegaly or masses detected.  MSK: No erythema, warmth or tenderness. Cap refil <2 sec all extrem. Neuro: A/Ox3. No focal deficits noted.   Skin: Warm, no lesions or rashe Psych: Normal affect and behavior. Judgement and thought content appropriate.     Lab Results:  CBC    Component Value Date/Time   WBC 5.4 08/08/2024 1014   RBC 5.48 08/08/2024  1014   HGB 15.7 08/08/2024 1014   HCT 47.6 08/08/2024 1014   PLT 264 08/08/2024 1014   MCV 87 08/08/2024 1014   MCH 28.6 08/08/2024 1014   MCHC 33.0 08/08/2024 1014   RDW 14.4 08/08/2024 1014   LYMPHSABS 1.7 08/08/2024 1014   EOSABS 0.1 08/08/2024 1014   BASOSABS 0.0 08/08/2024 1014    BMET    Component Value Date/Time   NA 139 08/08/2024 1014   K 4.5 08/08/2024 1014   CL 100 08/08/2024 1014   CO2 21 08/08/2024 1014   GLUCOSE 102 (H) 08/08/2024 1014   BUN 14 08/08/2024 1014   CREATININE 0.93 08/08/2024 1014   CALCIUM 10.3 (H) 08/08/2024 1014    BNP No results found for: BNP   Imaging:  No results found.  Administration History     None           No data to display          No results found for: NITRICOXIDE      Assessment & Plan:   OSA (obstructive sleep apnea) Severe OSA on recent HST. Reviewed risks of untreated OSA and potential treatment options. Given severity, recommendation was made to start CPAP therapy, which he was  agreeable to. Educated on proper use/care of device. Risks/benefits reviewed. Orders placed for new start auto CPAP 5-20 cmH2O. Healthy weight loss encouraged. Safe driving practices reviewed.  Patient Instructions  Start CPAP 5-20 cmH2O, mask of choice, heated humidity, every night, minimum of 4-6 hours a night.  Change equipment as directed. Wash your tubing with warm soap and water daily, hang to dry. Wash humidifier portion weekly. Use bottled, distilled water and change daily Be aware of reduced alertness and do not drive or operate heavy machinery if experiencing this or drowsiness.  Exercise encouraged, as tolerated. Healthy weight management discussed.  Avoid or decrease alcohol consumption and medications that make you more sleepy, if possible. Notify if persistent daytime sleepiness occurs even with consistent use of PAP therapy.  Change CPAP supplies... Every month Mask cushions and/or nasal pillows CPAP machine filters Every 3 months Mask frame (not including the headgear) CPAP tubing Every 6 months Mask headgear Chin strap (if applicable) Humidifier water tub  Notify us  if you haven't heard something in the next 2 weeks about the new CPAP  Follow up in 10-12 weeks with Katie Jayana Kotula,NP to review CPAP usage. If symptoms do not improve or worsen, please contact office for sooner follow up or seek emergency care.    Morbid obesity (HCC) BMI 47. Healthy weight loss encouraged    Advised if symptoms do not improve or worsen, to please contact office for sooner follow up or seek emergency care.   I spent 35 minutes of dedicated to the care of this patient on the date of this encounter to include pre-visit review of records, face-to-face time with the patient discussing conditions above, post visit ordering of testing, clinical documentation with the electronic health record, making appropriate referrals as documented, and communicating necessary findings to members of the  patients care team.  Comer LULLA Rouleau, NP 08/14/2024  Pt aware and understands NP's role.

## 2024-08-14 NOTE — Assessment & Plan Note (Signed)
 BMI 47. Healthy weight loss encouraged

## 2024-08-22 ENCOUNTER — Encounter: Payer: Self-pay | Admitting: Family Medicine

## 2024-08-24 ENCOUNTER — Telehealth: Payer: Self-pay

## 2024-08-24 NOTE — Telephone Encounter (Signed)
 Cmn received,sent via fax

## 2024-08-31 DIAGNOSIS — G4733 Obstructive sleep apnea (adult) (pediatric): Secondary | ICD-10-CM | POA: Diagnosis not present

## 2024-08-31 DIAGNOSIS — R400 Somnolence: Secondary | ICD-10-CM | POA: Diagnosis not present

## 2024-09-06 ENCOUNTER — Encounter: Admitting: Family Medicine

## 2024-09-09 DIAGNOSIS — E119 Type 2 diabetes mellitus without complications: Secondary | ICD-10-CM | POA: Diagnosis not present

## 2024-09-19 DIAGNOSIS — G4733 Obstructive sleep apnea (adult) (pediatric): Secondary | ICD-10-CM | POA: Diagnosis not present

## 2024-09-19 DIAGNOSIS — R400 Somnolence: Secondary | ICD-10-CM | POA: Diagnosis not present

## 2024-09-20 ENCOUNTER — Ambulatory Visit: Admitting: Nurse Practitioner

## 2024-09-20 ENCOUNTER — Encounter: Payer: Self-pay | Admitting: Nurse Practitioner

## 2024-09-20 VITALS — BP 132/82 | HR 87 | Temp 97.8°F | Ht >= 80 in | Wt >= 6400 oz

## 2024-09-20 DIAGNOSIS — G4733 Obstructive sleep apnea (adult) (pediatric): Secondary | ICD-10-CM | POA: Diagnosis not present

## 2024-09-20 DIAGNOSIS — E1159 Type 2 diabetes mellitus with other circulatory complications: Secondary | ICD-10-CM

## 2024-09-20 DIAGNOSIS — E119 Type 2 diabetes mellitus without complications: Secondary | ICD-10-CM

## 2024-09-20 DIAGNOSIS — I152 Hypertension secondary to endocrine disorders: Secondary | ICD-10-CM

## 2024-09-20 DIAGNOSIS — Z6841 Body Mass Index (BMI) 40.0 and over, adult: Secondary | ICD-10-CM

## 2024-09-20 DIAGNOSIS — E66813 Obesity, class 3: Secondary | ICD-10-CM

## 2024-09-20 DIAGNOSIS — E559 Vitamin D deficiency, unspecified: Secondary | ICD-10-CM | POA: Diagnosis not present

## 2024-09-20 DIAGNOSIS — Z7985 Long-term (current) use of injectable non-insulin antidiabetic drugs: Secondary | ICD-10-CM

## 2024-09-20 MED ORDER — VITAMIN D (ERGOCALCIFEROL) 1.25 MG (50000 UNIT) PO CAPS: 50000.0000 [IU] | ORAL_CAPSULE | ORAL | 0 refills | Status: DC

## 2024-09-20 MED ORDER — TIRZEPATIDE 12.5 MG/0.5ML ~~LOC~~ SOAJ: 12.5000 mg | SUBCUTANEOUS | 0 refills | Status: DC

## 2024-09-20 NOTE — Progress Notes (Signed)
 Office: [phone]  /  Fax: [phone]  WEIGHT SUMMARY AND BIOMETRICS  Weight Lost Since Last Visit: 7lb  Weight Gained Since Last Visit: 0lb   Vitals Temp: 97.8 F (36.6 C) BP: 132/82 Pulse Rate: 87 SpO2: 98 %   Anthropometric Measurements Height: 6' 8 (2.032 m) Weight: (!) 425 lb (192.8 kg) BMI (Calculated): 46.69 Weight at Last Visit: 432lb Weight Lost Since Last Visit: 7lb Weight Gained Since Last Visit: 0lb Starting Weight: 443lb Total Weight Loss (lbs): 18 lb (8.165 kg)   Body Composition  Body Fat %: 43.8 % Fat Mass (lbs): 186.2 lbs Muscle Mass (lbs): 227.2 lbs Total Body Water (lbs): 172.8 lbs Visceral Fat Rating : 31   Other Clinical Data Fasting: No Labs: No Today's Visit #: 21 Starting Date: 02/18/23     HPI  Chief Complaint: OBESITY  Steve Rojas is here to discuss his progress with his obesity treatment plan. He is on the keeping a food journal and adhering to recommended goals of 2000 calories and 120 protein and states he is following his eating plan approximately 50-60 % of the time. He states he is exercising 60 minutes 2 days per week.   Interval History:  Since last office visit he has lost 7 pounds.  He notes he has been making healthier choices and watching his portion sizes.  He is not tracking.  He is drinking diet sodas, tea and water.  He is walking to stay active.  He is not currently swimming.  He is planning to move to [place]  in the next couple of months.    Pharmacotherapy for weight loss: He is not currently taking medications  for medical weight loss.    Previous pharmacotherapy for medical weight loss:  none   Bariatric surgery:  Patient has not had bariatric surgery    Pharmacotherapy for DMT2:   He is currently taking Mounjaro  12.5 mg dose, Amaryl 2mg  (taking off and on) and Metformin  XR 500mg  1 tablet am and 2 pm.  He took Farxiga for one month and didn't continue taking it.  Reports some side effects of nausea and  constipation.   Last A1c was 6.5 CBGs: 87, 169, 179, 189, 103-105-average 148 Episodes of hypoglycemia: Denies  He is taking ASA 81mg  and Lipitor 20mg . He was started on Lisinopril 5mg  since his last visit.  Last eye exam:  07/06/24 He has tried Ozempic  in the past.  Lab Results  Component Value Date   HGBA1C 6.5 (H) 08/08/2024   Lab Results  Component Value Date   LDLCALC 96 08/08/2024   CREATININE 0.93 08/08/2024    Obstructive Sleep Apnea Steve Rojas has a diagnosis of sleep apnea. He reports that he is using a CPAP regularly.  He received his CPAP machine 1-2 weeks ago and is slowly adjusting to wearing it.    Vit D deficiency  He is taking Vit D 50,000 IU weekly.  Denies side effects.  Denies nausea, vomiting or muscle weakness.    Lab Results  Component Value Date   VD25OH 33.0 08/08/2024   VD25OH 43.2 07/22/2023   VD25OH 22.5 (L) 02/18/2023     PHYSICAL EXAM:  Blood pressure 132/82, pulse 87, temperature 97.8 F (36.6 C), height 6' 8 (2.032 m), weight (!) 425 lb (192.8 kg), SpO2 98%. Body mass index is 46.69 kg/m.  General: He is overweight, cooperative, alert, well developed, and in no acute distress. PSYCH: Has normal mood, affect and thought process.   Extremities: No edema.  Neurologic: No  gross sensory or motor deficits. No tremors or fasciculations noted.    DIAGNOSTIC DATA REVIEWED:  BMET    Component Value Date/Time   NA 139 08/08/2024 1014   K 4.5 08/08/2024 1014   CL 100 08/08/2024 1014   CO2 21 08/08/2024 1014   GLUCOSE 102 (H) 08/08/2024 1014   BUN 14 08/08/2024 1014   CREATININE 0.93 08/08/2024 1014   CALCIUM 10.3 (H) 08/08/2024 1014   Lab Results  Component Value Date   HGBA1C 6.5 (H) 08/08/2024   Lab Results  Component Value Date   INSULIN  9.4 02/18/2023   Lab Results  Component Value Date   TSH 2.680 08/08/2024   CBC    Component Value Date/Time   WBC 5.4 08/08/2024 1014   RBC 5.48 08/08/2024 1014   HGB 15.7 08/08/2024 1014    HCT 47.6 08/08/2024 1014   PLT 264 08/08/2024 1014   MCV 87 08/08/2024 1014   MCH 28.6 08/08/2024 1014   MCHC 33.0 08/08/2024 1014   RDW 14.4 08/08/2024 1014   Iron Studies No results found for: IRON, TIBC, FERRITIN, IRONPCTSAT Lipid Panel     Component Value Date/Time   CHOL 157 08/08/2024 1014   TRIG 83 08/08/2024 1014   HDL 45 08/08/2024 1014   LDLCALC 96 08/08/2024 1014   Hepatic Function Panel     Component Value Date/Time   PROT 6.7 08/08/2024 1014   ALBUMIN 4.6 08/08/2024 1014   AST 25 08/08/2024 1014   ALT 32 08/08/2024 1014   ALKPHOS 99 08/08/2024 1014   BILITOT 1.0 08/08/2024 1014      Component Value Date/Time   TSH 2.680 08/08/2024 1014   Nutritional Lab Results  Component Value Date   VD25OH 33.0 08/08/2024   VD25OH 43.2 07/22/2023   VD25OH 22.5 (L) 02/18/2023     ASSESSMENT AND PLAN  TREATMENT PLAN FOR OBESITY:  Recommended Dietary Goals  Steve Rojas is currently in the action stage of change. As such, his goal is to continue weight management plan. He has agreed to keeping a food journal and adhering to recommended goals of 2000 calories and 120+grams protein.  Behavioral Intervention  We discussed the following Behavioral Modification Strategies today: increasing lean protein intake to established goals, decreasing simple carbohydrates , increasing vegetables, increasing fiber rich foods, increasing water intake , work on meal planning and preparation, work on tracking and journaling calories using tracking application, reading food labels , keeping healthy foods at home, planning for success, continue to work on maintaining a reduced calorie state, getting the recommended amount of protein, incorporating whole foods, making healthy choices, staying well hydrated and practicing mindfulness when eating., and increase protein intake, fibrous foods (25 grams per day for women, 30 grams for men) and water to improve satiety and decrease hunger signals.  .  Additional resources provided today: NA  Recommended Physical Activity Goals  Steve Rojas has been advised to work up to 150 minutes of moderate intensity aerobic activity a week and strengthening exercises 2-3 times per week for cardiovascular health, weight loss maintenance and preservation of muscle mass.   He has agreed to Think about enjoyable ways to increase daily physical activity and overcoming barriers to exercise, Increase physical activity in their day and reduce sedentary time (increase NEAT)., Increase the intensity, frequency or duration of aerobic exercises  , Continue to gradually increase the amount and intensity of exercise routine, Increase volume of physical activity to a goal of 240 minutes a week, and Combine aerobic and strengthening  exercises for efficiency and improved cardiometabolic health.   ASSOCIATED CONDITIONS ADDRESSED TODAY  Action/Plan  Type 2 diabetes mellitus without complication, without long-term current use of insulin  (HCC) -     Continue Tirzepatide ; Inject 12.5 mg into the skin once a week.  Dispense: 2 mL; Refill: 0. Side effects discussed.  To continue following up with PCP until he moves to [place]    Obstructive sleep apnea syndrome Continue CPAP nightly  Hypertension associated with diabetes (HCC) Continue to follow up with PCP and take meds as directed  Vitamin D  deficiency -     Vitamin D  (Ergocalciferol ); Take 1 capsule (50,000 Units total) by mouth every 7 (seven) days.  Dispense: 5 capsule; Refill: 0  Class 3 severe obesity due to excess calories with serious comorbidity and body mass index (BMI) of 45.0 to 49.9 in adult [facility])      Labs reviewed in chart with patient from 08/08/24   Return in about 4 weeks (around 10/18/2024).SABRA He was informed of the importance of frequent follow up visits to maximize his success with intensive lifestyle modifications for his multiple health conditions.   ATTESTASTION STATEMENTS:  Reviewed by  clinician on day of visit: allergies, medications, problem list, medical history, surgical history, family history, social history, and previous encounter notes.     Steve Rojas SAUNDERS. Zachery Niswander FNP-C

## 2024-09-21 ENCOUNTER — Encounter: Payer: Self-pay | Admitting: Nurse Practitioner

## 2024-10-01 DIAGNOSIS — R400 Somnolence: Secondary | ICD-10-CM | POA: Diagnosis not present

## 2024-10-01 DIAGNOSIS — G4733 Obstructive sleep apnea (adult) (pediatric): Secondary | ICD-10-CM | POA: Diagnosis not present

## 2024-10-27 ENCOUNTER — Ambulatory Visit: Admitting: Nurse Practitioner

## 2024-10-31 ENCOUNTER — Ambulatory Visit (INDEPENDENT_AMBULATORY_CARE_PROVIDER_SITE_OTHER): Admitting: Nurse Practitioner

## 2024-10-31 ENCOUNTER — Encounter: Payer: Self-pay | Admitting: Nurse Practitioner

## 2024-10-31 VITALS — BP 128/84 | HR 72 | Temp 98.7°F | Ht >= 80 in | Wt >= 6400 oz

## 2024-10-31 DIAGNOSIS — J069 Acute upper respiratory infection, unspecified: Secondary | ICD-10-CM

## 2024-10-31 DIAGNOSIS — Z6841 Body Mass Index (BMI) 40.0 and over, adult: Secondary | ICD-10-CM

## 2024-10-31 DIAGNOSIS — G4733 Obstructive sleep apnea (adult) (pediatric): Secondary | ICD-10-CM | POA: Diagnosis not present

## 2024-10-31 NOTE — Assessment & Plan Note (Signed)
 Severe OSA on CPAP. Suboptimal compliance related to recent move and URI. Back on PAP therapy. Reviewed risks of untreated OSA and encouraged nightly usage. Receives benefit from use. Good control on download. He is having some pressure difficulties. Will tighten his settings to 7-10 cmH2O. Advised to notify of any ongoing issues. Healthy weight loss encouraged. Safe driving practices reviewed.  Patient Instructions  Continue to use CPAP every night, minimum of 4-6 hours a night.  Change equipment as directed. Wash your tubing with warm soap and water daily, hang to dry. Wash humidifier portion weekly. Use bottled, distilled water and change daily Be aware of reduced alertness and do not drive or operate heavy machinery if experiencing this or drowsiness.  Exercise encouraged, as tolerated. Healthy weight management discussed.  Avoid or decrease alcohol consumption and medications that make you more sleepy, if possible. Notify if persistent daytime sleepiness occurs even with consistent use of PAP therapy.  Change CPAP supplies... Every month Mask cushions and/or nasal pillows CPAP machine filters Every 3 months Mask frame (not including the headgear) CPAP tubing Every 6 months Mask headgear Chin strap (if applicable) Humidifier water tub   Increase your usage as much as possible Adjusted your settings today   We discussed how untreated sleep apnea puts an individual at risk for cardiac arrhthymias, pulm HTN, DM, stroke and increases their risk for daytime accidents.  Flonase  nasal spray 2 sprays each nostril before bedtime for nasal congestion/post nasal drainage   Follow up in 6  months with Dr. Jess, or sooner, if needed

## 2024-10-31 NOTE — Progress Notes (Signed)
 "  @Patient  ID: Steve Rojas, male    DOB: 1961-11-09, 63 y.o.   MRN: [id number]  Chief Complaint  Patient presents with   Obstructive Sleep Apnea    Doing good. No problems with the mask or the pressure. He did have to adjust his pressure down.     Referring provider: Kotturi, Vinay K, MD  HPI: 63 year old male, former smoker followed for OSA on CPAP.  Past medical history significant for HLD, DM 2.  TEST/EVENTS:  07/05/2024 HST: AHI 32/h, SpO2 low 70%  06/20/2024: OV with Steve Helin NP JRAKE RODRIQUEZ is a 63 year old male presents with frequent nighttime urination and suspected sleep apnea. He experiences frequent nighttime urination, which prompted a consultation with a urologist. The urologist recommended a sleep study due to symptoms of restless sleep, fatigue, and snoring. He is participating in a wellness program to lose weight, and they also encouraged he have a sleep study based on his symptoms. He has a history of type 2 diabetes and initially attributed his fatigue to this condition. His energy levels have significantly decreased since turning 52, with lethargy and a lack of motivation to engage in physical activities. Previously active, he now goes to bed at 9 PM, wakes up multiple times during the night, and feels ready to sleep again by noon. He quit smoking in the past, leading to a weight gain of approximately 100 pounds. He suspects untreated sleep apnea may have contributed to development of diabetes. No waking up choking, gasping, or coughing, and no drowsy driving or sleepwalking. He occasionally wakes up with morning headaches, attributed to sinus pressure changes. His wife has mentioned that he stops breathing during sleep. No sleep paralysis, hx of narcolepsy or cataplexy. He consumes minimal caffeine, primarily through Diet Coke and occasional tea, and has about one alcoholic beverage per month. 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 He lives with his wife. Never had a prior sleep study. No O2 use. [place] 6  08/14/2024: OV with [place]  NP LUISFERNANDO BRIGHTWELL is a 63 year old male with severe sleep apnea who presents for management of his condition. He is accompanied by his wife. He has been diagnosed with severe sleep apnea, confirmed by a recent sleep study. He feels unchanged compared to his last visit. Always tired. No drowsy driving. His wife has CPAP so he is familiar with therapy. Wants to discuss treatment options.   10/31/2024: Today - follow up Discussed the use of AI scribe software for clinical note transcription with the patient, who gave verbal consent to proceed.  History of Present Illness Steve Rojas is a 63 year old male with sleep apnea who presents for CPAP management.  He has been experiencing issues with CPAP usage due to recent life events, including a move and illness with nocturnal cough. He has missed a few weeks on CPAP as a result but has resumed usage in the last couple of nights. He sometimes feels like he is not getting enough air but other times notices that the pressure seems high with some leaks. He sleeps better with it than without. He feels better rested with it. Energy is improved during the day. No issues with drowsy driving.   He has been experiencing a chest cold for the past couple of weeks, which has caused phlegm and coughing fits, leading him to avoid CPAP use for about a week and a half. The cold  symptoms are improving. Still has some mild postnasal drainage but cough is mostly resolved. No fevers, chills, sinus tenderness, headaches, sore throat, ear pressure. He has not been using any nasal sprays but has Flonase  at home.    Allergies  Allergen Reactions   Penicillins Other (See Comments)    Had reaction as a child- unsure of what     Immunization History  Administered Date(s) Administered   Influenza Inj Mdck Quad Pf  08/22/2021   Influenza, Mdck, Trivalent,PF 6+ MOS(egg free) 12/03/2023   Influenza,inj,Quad PF,6+ Mos 07/30/2020   Moderna Covid-19 Vaccine Bivalent Booster 16yrs & up 11/13/2021   Moderna Sars-Covid-2 Vaccination 02/12/2020, 03/17/2020, 10/22/2020   Pneumococcal Polysaccharide-23 11/21/2014   Tdap 11/28/2009, 12/15/2019    Past Medical History:  Diagnosis Date   Back pain    Chest pain    Diabetes (HCC)    Hyperlipidemia    Hypochloremia    Joint pain    Vitamin B12 deficiency     Tobacco History: Social History   Tobacco Use  Smoking Status Former   Current packs/day: 0.00   Types: Cigarettes   Quit date: 11/09/1992   Years since quitting: 31.9  Smokeless Tobacco Never   Counseling given: Not Answered   Outpatient Medications Prior to Visit  Medication Sig Dispense Refill   aspirin 81 MG tablet Take 81 mg by mouth daily.     atorvastatin  (LIPITOR) 40 MG tablet Take 1 tablet (40 mg total) by mouth daily. 90 tablet 3   cetirizine -pseudoephedrine  (ZYRTEC -D) 5-120 MG tablet Take 1 tablet by mouth daily. 30 tablet 0   Cyanocobalamin  (B-12 PO) Take 1 tablet by mouth daily.     dapagliflozin  propanediol (FARXIGA ) 5 MG TABS tablet Take 1 tablet (5 mg total) by mouth daily. 90 tablet 3   Docusate Sodium (DSS) 100 MG CAPS Take by mouth.     fluticasone  (FLONASE ) 50 MCG/ACT nasal spray Place 2 sprays into both nostrils daily. 16 g 0   ibuprofen  (ADVIL ,MOTRIN ) 600 MG tablet Take 1 tablet (600 mg total) by mouth 3 (three) times daily. (Patient taking differently: Take 600 mg by mouth every 8 (eight) hours as needed.) 30 tablet 0   lisinopril  (ZESTRIL ) 5 MG tablet Take 1 tablet (5 mg total) by mouth daily. 90 tablet 3   metFORMIN  (GLUCOPHAGE -XR) 500 MG 24 hr tablet TAKE 3 TABLETS (1,500 MG TOTAL) BY MOUTH DAILY 270 tablet 0   sildenafil  (VIAGRA ) 100 MG tablet Take 1 tablet (100 mg total) by mouth daily as needed for erectile dysfunction (take 45 minutes prior to sexual activity). 30  tablet 6   tadalafil (CIALIS) 20 MG tablet Take 1 tablet by mouth daily as needed. As directed for ED     tamsulosin (FLOMAX) 0.4 MG CAPS capsule Take 0.4 mg by mouth daily.     tirzepatide  (MOUNJARO ) 12.5 MG/0.5ML Pen Inject 12.5 mg into the skin once a week. 2 mL 0   Vitamin D , Ergocalciferol , (DRISDOL ) 1.25 MG (50000 UNIT) CAPS capsule Take 1 capsule (50,000 Units total) by mouth every 7 (seven) days. 5 capsule 0   No facility-administered medications prior to visit.     Review of Systems: as above    Physical Exam:  BP 128/84   Pulse 72   Temp 98.7 F (37.1 C) (Temporal)   Ht 6' 8 (2.032 m)   Wt (!) 418 lb 9.6 oz (189.9 kg)   SpO2 94%   BMI 45.99 kg/m   GEN: Pleasant, interactive, well-kempt; morbidly obese;  in no acute distress HEENT:  Normocephalic and atraumatic. PERRLA. Sclera white. Nasal turbinates pink, moist and patent bilaterally. No rhinorrhea present. Oropharynx pink and moist, without exudate or edema. No lesions, ulcerations, or postnasal drip. Mallampati IV NECK:  Supple w/ fair ROM.  CV: RRR, no m/r/g, no peripheral edema. Pulses intact, +2 bilaterally.  PULMONARY:  Unlabored, regular breathing. Clear bilaterally A&P w/o wheezes/rales/rhonchi. No accessory muscle use.  GI: BS present and normoactive. Soft, non-tender to palpation.  Neuro: A/Ox3. No focal deficits noted.   Skin: Warm, no lesions or rashe Psych: Normal affect and behavior. Judgement and thought content appropriate.     Lab Results:  CBC    Component Value Date/Time   WBC 5.4 08/08/2024 1014   RBC 5.48 08/08/2024 1014   HGB 15.7 08/08/2024 1014   HCT 47.6 08/08/2024 1014   PLT 264 08/08/2024 1014   MCV 87 08/08/2024 1014   MCH 28.6 08/08/2024 1014   MCHC 33.0 08/08/2024 1014   RDW 14.4 08/08/2024 1014   LYMPHSABS 1.7 08/08/2024 1014   EOSABS 0.1 08/08/2024 1014   BASOSABS 0.0 08/08/2024 1014    BMET    Component Value Date/Time   NA 139 08/08/2024 1014   K 4.5 08/08/2024  1014   CL 100 08/08/2024 1014   CO2 21 08/08/2024 1014   GLUCOSE 102 (H) 08/08/2024 1014   BUN 14 08/08/2024 1014   CREATININE 0.93 08/08/2024 1014   CALCIUM  10.3 (H) 08/08/2024 1014    BNP No results found for: BNP   Imaging:  No results found.  Administration History     None           No data to display          No results found for: NITRICOXIDE      Assessment & Plan:   OSA (obstructive sleep apnea) Severe OSA on CPAP. Suboptimal compliance related to recent move and URI. Back on PAP therapy. Reviewed risks of untreated OSA and encouraged nightly usage. Receives benefit from use. Good control on download. He is having some pressure difficulties. Will tighten his settings to 7-10 cmH2O. Advised to notify of any ongoing issues. Healthy weight loss encouraged. Safe driving practices reviewed.  Patient Instructions  Continue to use CPAP every night, minimum of 4-6 hours a night.  Change equipment as directed. Wash your tubing with warm soap and water daily, hang to dry. Wash humidifier portion weekly. Use bottled, distilled water and change daily Be aware of reduced alertness and do not drive or operate heavy machinery if experiencing this or drowsiness.  Exercise encouraged, as tolerated. Healthy weight management discussed.  Avoid or decrease alcohol consumption and medications that make you more sleepy, if possible. Notify if persistent daytime sleepiness occurs even with consistent use of PAP therapy.  Change CPAP supplies... Every month Mask cushions and/or nasal pillows CPAP machine filters Every 3 months Mask frame (not including the headgear) CPAP tubing Every 6 months Mask headgear Chin strap (if applicable) Humidifier water tub   Increase your usage as much as possible Adjusted your settings today   We discussed how untreated sleep apnea puts an individual at risk for cardiac arrhthymias, pulm HTN, DM, stroke and increases their risk for  daytime accidents.  Flonase  nasal spray 2 sprays each nostril before bedtime for nasal congestion/post nasal drainage   Follow up in 6  months with Dr. Jess, or sooner, if needed    Morbid obesity (HCC) BMI 45. Healthy weight loss encouraged  URI (upper respiratory infection) Clinically improving. Advised to use intranasal steroid with flonase  for postnasal drainage management. Lung exam benign. No infectious symptoms.     Advised if symptoms do not improve or worsen, to please contact office for sooner follow up or seek emergency care.   I spent 35 minutes of dedicated to the care of this patient on the date of this encounter to include pre-visit review of records, face-to-face time with the patient discussing conditions above, post visit ordering of testing, clinical documentation with the electronic health record, making appropriate referrals as documented, and communicating necessary findings to members of the patients care team.  Comer LULLA Rouleau, NP 10/31/2024  Pt aware and understands NP's role.   "

## 2024-10-31 NOTE — Assessment & Plan Note (Signed)
BMI 45. Healthy weight loss encouraged.  

## 2024-10-31 NOTE — Patient Instructions (Signed)
 Continue to use CPAP every night, minimum of 4-6 hours a night.  Change equipment as directed. Wash your tubing with warm soap and water daily, hang to dry. Wash humidifier portion weekly. Use bottled, distilled water and change daily Be aware of reduced alertness and do not drive or operate heavy machinery if experiencing this or drowsiness.  Exercise encouraged, as tolerated. Healthy weight management discussed.  Avoid or decrease alcohol consumption and medications that make you more sleepy, if possible. Notify if persistent daytime sleepiness occurs even with consistent use of PAP therapy.  Change CPAP supplies... Every month Mask cushions and/or nasal pillows CPAP machine filters Every 3 months Mask frame (not including the headgear) CPAP tubing Every 6 months Mask headgear Chin strap (if applicable) Humidifier water tub   Increase your usage as much as possible Adjusted your settings today   We discussed how untreated sleep apnea puts an individual at risk for cardiac arrhthymias, pulm HTN, DM, stroke and increases their risk for daytime accidents.  Flonase  nasal spray 2 sprays each nostril before bedtime for nasal congestion/post nasal drainage   Follow up in 6  months with Dr. Jess, or sooner, if needed

## 2024-10-31 NOTE — Assessment & Plan Note (Signed)
 Clinically improving. Advised to use intranasal steroid with flonase  for postnasal drainage management. Lung exam benign. No infectious symptoms.

## 2024-11-14 ENCOUNTER — Ambulatory Visit: Admitting: Nurse Practitioner

## 2024-11-14 ENCOUNTER — Encounter: Payer: Self-pay | Admitting: Nurse Practitioner

## 2024-11-14 VITALS — BP 112/75 | HR 100 | Temp 98.0°F | Ht >= 80 in | Wt >= 6400 oz

## 2024-11-14 DIAGNOSIS — Z6841 Body Mass Index (BMI) 40.0 and over, adult: Secondary | ICD-10-CM | POA: Diagnosis not present

## 2024-11-14 DIAGNOSIS — E559 Vitamin D deficiency, unspecified: Secondary | ICD-10-CM

## 2024-11-14 DIAGNOSIS — E119 Type 2 diabetes mellitus without complications: Secondary | ICD-10-CM | POA: Diagnosis not present

## 2024-11-14 DIAGNOSIS — Z7985 Long-term (current) use of injectable non-insulin antidiabetic drugs: Secondary | ICD-10-CM

## 2024-11-14 MED ORDER — TIRZEPATIDE 12.5 MG/0.5ML ~~LOC~~ SOAJ: 12.5000 mg | SUBCUTANEOUS | 0 refills | Status: DC

## 2024-11-14 MED ORDER — VITAMIN D (ERGOCALCIFEROL) 1.25 MG (50000 UNIT) PO CAPS: 50000.0000 [IU] | ORAL_CAPSULE | ORAL | 0 refills | Status: AC

## 2024-11-14 MED ORDER — TIRZEPATIDE 12.5 MG/0.5ML ~~LOC~~ SOAJ: 12.5000 mg | SUBCUTANEOUS | 1 refills | Status: AC

## 2024-11-14 NOTE — Progress Notes (Signed)
 "  Office: [phone]  /  Fax: [phone]  WEIGHT SUMMARY AND BIOMETRICS  Weight Lost Since Last Visit: 17lb  Weight Gained Since Last Visit: 0lb   Vitals Temp: 98 F (36.7 C) BP: 112/75 Pulse Rate: 100 SpO2: 97 %   Anthropometric Measurements Height: 6' 8 (2.032 m) Weight: (!) 408 lb (185.1 kg) BMI (Calculated): 44.82 Weight at Last Visit: 425lb Weight Lost Since Last Visit: 17lb Weight Gained Since Last Visit: 0lb Starting Weight: 443lb Total Weight Loss (lbs): 35 lb (15.9 kg)   Body Composition  Body Fat %: 43.2 % Fat Mass (lbs): 176.6 lbs Muscle Mass (lbs): 220.8 lbs Total Body Water (lbs): 170.8 lbs Visceral Fat Rating : 30   Other Clinical Data Fasting: no Labs: no Today's Visit #: 22 Starting Date: 02/18/23     HPI  Chief Complaint: OBESITY  Steve Rojas is here to discuss his progress with his obesity treatment plan. He is on the keeping a food journal and adhering to recommended goals of 2000 calories and 120 protein and states he is following his eating plan approximately 55 % of the time. He states he is exercising 0 minutes 0 days per week.   Interval History:  Since last office visit he has lost 17 pounds. He moved into a new house since his last visit.  He is surprised he lost weight especially over the holidays and with moving. He had to eat out for several weeks while moving. He has been watching his portion sizes.  He tried to limit junk food and has been increasing his protein intake. He is drinking water and diet soda.  He hurt his back when he moved.  He is not currently exercising.  He saw a chiropractor yesterday.    He lost several of his medications during moving.  He hasn't taken lipitor and farxiga  for the past 2 weeks.   Pharmacotherapy for weight loss: He is not currently taking medications  for medical weight loss.    Previous pharmacotherapy for medical weight loss:  none   Bariatric surgery:  Patient has not had bariatric  surgery   Pharmacotherapy for DMT2:   He is currently taking Mounjaro  12.5 mg dose and Metformin  XR 500mg  1 tablet am and 2 pm.  Reports some side effects of nausea and constipation.   Last A1c was 6.5 CBGs: 100-125 Episodes of hypoglycemia: Unsure-misplaced his meter with moving, recently found it.  He is taking ASA 81mg , Lisinopril  5mg  and Lipitor 20mg .(Has been off lipitor and lisinopril  for 2 weeks)   Last eye exam:  07/06/24 He has tried Ozempic  in the past.  Lab Results  Component Value Date   HGBA1C 6.5 (H) 08/08/2024   Lab Results  Component Value Date   LDLCALC 96 08/08/2024   CREATININE 0.93 08/08/2024    Vit D deficiency  He is taking Vit D 50,000 IU weekly.  Denies side effects.  Denies nausea, vomiting or muscle weakness.    Lab Results  Component Value Date   VD25OH 33.0 08/08/2024   VD25OH 43.2 07/22/2023   VD25OH 22.5 (L) 02/18/2023     PHYSICAL EXAM:  Blood pressure 112/75, pulse 100, temperature 98 F (36.7 C), height 6' 8 (2.032 m), weight (!) 408 lb (185.1 kg), SpO2 97%. Body mass index is 44.82 kg/m.  General: He is overweight, cooperative, alert, well developed, and in no acute distress. PSYCH: Has normal mood, affect and thought process.   Extremities: No edema.  Neurologic: No gross sensory or motor  deficits. No tremors or fasciculations noted.    DIAGNOSTIC DATA REVIEWED:  BMET    Component Value Date/Time   NA 139 08/08/2024 1014   K 4.5 08/08/2024 1014   CL 100 08/08/2024 1014   CO2 21 08/08/2024 1014   GLUCOSE 102 (H) 08/08/2024 1014   BUN 14 08/08/2024 1014   CREATININE 0.93 08/08/2024 1014   CALCIUM  10.3 (H) 08/08/2024 1014   Lab Results  Component Value Date   HGBA1C 6.5 (H) 08/08/2024   Lab Results  Component Value Date   INSULIN  9.4 02/18/2023   Lab Results  Component Value Date   TSH 2.680 08/08/2024   CBC    Component Value Date/Time   WBC 5.4 08/08/2024 1014   RBC 5.48 08/08/2024 1014   HGB 15.7 08/08/2024  1014   HCT 47.6 08/08/2024 1014   PLT 264 08/08/2024 1014   MCV 87 08/08/2024 1014   MCH 28.6 08/08/2024 1014   MCHC 33.0 08/08/2024 1014   RDW 14.4 08/08/2024 1014   Iron Studies No results found for: IRON, TIBC, FERRITIN, IRONPCTSAT Lipid Panel     Component Value Date/Time   CHOL 157 08/08/2024 1014   TRIG 83 08/08/2024 1014   HDL 45 08/08/2024 1014   LDLCALC 96 08/08/2024 1014   Hepatic Function Panel     Component Value Date/Time   PROT 6.7 08/08/2024 1014   ALBUMIN 4.6 08/08/2024 1014   AST 25 08/08/2024 1014   ALT 32 08/08/2024 1014   ALKPHOS 99 08/08/2024 1014   BILITOT 1.0 08/08/2024 1014      Component Value Date/Time   TSH 2.680 08/08/2024 1014   Nutritional Lab Results  Component Value Date   VD25OH 33.0 08/08/2024   VD25OH 43.2 07/22/2023   VD25OH 22.5 (L) 02/18/2023     ASSESSMENT AND PLAN  TREATMENT PLAN FOR OBESITY:  Recommended Dietary Goals  Brion is currently in the action stage of change. As such, his goal is to continue weight management plan. He has agreed to keeping a food journal and adhering to recommended goals of 2000 calories and 120+ grams of protein.  Behavioral Intervention  We discussed the following Behavioral Modification Strategies today: increasing lean protein intake to established goals, decreasing simple carbohydrates , increasing vegetables, increasing fiber rich foods, increasing water intake , work on meal planning and preparation, work on tracking and journaling calories using tracking application, reading food labels , keeping healthy foods at home, planning for success, continue to work on maintaining a reduced calorie state, getting the recommended amount of protein, incorporating whole foods, making healthy choices, staying well hydrated and practicing mindfulness when eating., and increase protein intake, fibrous foods (25 grams per day for women, 30 grams for men) and water to improve satiety and decrease  hunger signals. .  Additional resources provided today: NA  Recommended Physical Activity Goals  Pacen has been advised to work up to 150 minutes of moderate intensity aerobic activity a week and strengthening exercises 2-3 times per week for cardiovascular health, weight loss maintenance and preservation of muscle mass.   He has agreed to Think about enjoyable ways to increase daily physical activity and overcoming barriers to exercise, Increase physical activity in their day and reduce sedentary time (increase NEAT)., Work on scheduling and tracking physical activity. , Increase volume of physical activity to a goal of 240 minutes a week, and Combine aerobic and strengthening exercises for efficiency and improved cardiometabolic health.   ASSOCIATED CONDITIONS ADDRESSED TODAY  Action/Plan  Type 2 diabetes mellitus without complication, without long-term current use of insulin  (HCC) -     Ambulatory referral to Family Practice-patient has moved and needs a new PCP -     Continue Tirzepatide ; Inject 12.5 mg into the skin once a week.  Dispense: 2 mL; Refill: 1  Vitamin D  deficiency -     Vitamin D  (Ergocalciferol ); Take 1 capsule (50,000 Units total) by mouth every 7 (seven) days.  Dispense: 5 capsule; Refill: 0  Morbid obesity (HCC)  BMI 40.0-44.9, adult [facility])      Has appt with urology on 12/07/24   Return in about 2 months (around 01/12/2025).SABRA He was informed of the importance of frequent follow up visits to maximize his success with intensive lifestyle modifications for his multiple health conditions.   ATTESTASTION STATEMENTS:  Reviewed by clinician on day of visit: allergies, medications, problem list, medical history, surgical history, family history, social history, and previous encounter notes.     Corean SAUNDERS. Nasiah Polinsky FNP-C "

## 2024-11-20 ENCOUNTER — Encounter: Payer: Self-pay | Admitting: Nurse Practitioner

## 2024-11-20 MED ORDER — METFORMIN HCL ER 500 MG PO TB24: 1500.0000 mg | ORAL_TABLET | Freq: Every day | ORAL | 0 refills | Status: AC

## 2024-12-07 ENCOUNTER — Ambulatory Visit: Admitting: Urology

## 2024-12-21 ENCOUNTER — Ambulatory Visit: Admitting: Urology

## 2025-01-16 ENCOUNTER — Ambulatory Visit: Admitting: Nurse Practitioner

## 2025-09-24 ENCOUNTER — Encounter: Admitting: Family Medicine
